# Patient Record
Sex: Female | Born: 1970 | Race: White | Hispanic: No | Marital: Married | Smoking: Never smoker
Health system: Southern US, Community
[De-identification: ages and names within clinical notes are randomized; demographics above are authoritative.]

## PROBLEM LIST (undated history)

## (undated) DIAGNOSIS — C541 Malignant neoplasm of endometrium: Secondary | ICD-10-CM

## (undated) DIAGNOSIS — I10 Essential (primary) hypertension: Secondary | ICD-10-CM

## (undated) DIAGNOSIS — E876 Hypokalemia: Secondary | ICD-10-CM

## (undated) HISTORY — DX: Morbid (severe) obesity due to excess calories: E66.01

## (undated) HISTORY — PX: ABDOMINAL HYSTERECTOMY: SHX81

## (undated) HISTORY — PX: APPENDECTOMY: SHX54

## (undated) HISTORY — PX: CHOLECYSTECTOMY: SHX55

---

## 2015-01-09 DIAGNOSIS — I10 Essential (primary) hypertension: Secondary | ICD-10-CM | POA: Insufficient documentation

## 2015-01-09 DIAGNOSIS — C541 Malignant neoplasm of endometrium: Secondary | ICD-10-CM | POA: Insufficient documentation

## 2015-01-09 DIAGNOSIS — K219 Gastro-esophageal reflux disease without esophagitis: Secondary | ICD-10-CM | POA: Insufficient documentation

## 2015-01-09 HISTORY — DX: Morbid (severe) obesity due to excess calories: E66.01

## 2018-02-27 DIAGNOSIS — Z8639 Personal history of other endocrine, nutritional and metabolic disease: Secondary | ICD-10-CM | POA: Diagnosis not present

## 2018-02-27 DIAGNOSIS — I10 Essential (primary) hypertension: Secondary | ICD-10-CM | POA: Diagnosis not present

## 2018-03-06 ENCOUNTER — Emergency Department: Payer: BLUE CROSS/BLUE SHIELD | Admitting: Anesthesiology

## 2018-03-06 ENCOUNTER — Ambulatory Visit: Admit: 2018-03-06 | Payer: BLUE CROSS/BLUE SHIELD | Admitting: Surgery

## 2018-03-06 ENCOUNTER — Observation Stay
Admission: EM | Admit: 2018-03-06 | Discharge: 2018-03-08 | Disposition: A | Discharge status: Home / Self Care | Payer: BLUE CROSS/BLUE SHIELD | Attending: Surgery | Admitting: Surgery

## 2018-03-06 ENCOUNTER — Other Ambulatory Visit: Payer: Self-pay

## 2018-03-06 ENCOUNTER — Emergency Department: Payer: BLUE CROSS/BLUE SHIELD

## 2018-03-06 ENCOUNTER — Encounter
Admission: EM | Disposition: A | Discharge status: Home / Self Care | Payer: Self-pay | Source: Home / Self Care | Attending: Emergency Medicine

## 2018-03-06 DIAGNOSIS — Z6841 Body Mass Index (BMI) 40.0 and over, adult: Secondary | ICD-10-CM | POA: Diagnosis not present

## 2018-03-06 DIAGNOSIS — I10 Essential (primary) hypertension: Secondary | ICD-10-CM | POA: Insufficient documentation

## 2018-03-06 DIAGNOSIS — Z90710 Acquired absence of both cervix and uterus: Secondary | ICD-10-CM | POA: Diagnosis not present

## 2018-03-06 DIAGNOSIS — Z79899 Other long term (current) drug therapy: Secondary | ICD-10-CM | POA: Diagnosis not present

## 2018-03-06 DIAGNOSIS — R1900 Intra-abdominal and pelvic swelling, mass and lump, unspecified site: Secondary | ICD-10-CM | POA: Diagnosis not present

## 2018-03-06 DIAGNOSIS — K353 Acute appendicitis with localized peritonitis, without perforation or gangrene: Secondary | ICD-10-CM | POA: Diagnosis not present

## 2018-03-06 DIAGNOSIS — Z8544 Personal history of malignant neoplasm of other female genital organs: Secondary | ICD-10-CM | POA: Diagnosis not present

## 2018-03-06 DIAGNOSIS — E876 Hypokalemia: Secondary | ICD-10-CM | POA: Insufficient documentation

## 2018-03-06 DIAGNOSIS — K3580 Unspecified acute appendicitis: Secondary | ICD-10-CM | POA: Diagnosis not present

## 2018-03-06 DIAGNOSIS — N200 Calculus of kidney: Secondary | ICD-10-CM | POA: Diagnosis not present

## 2018-03-06 DIAGNOSIS — R1031 Right lower quadrant pain: Secondary | ICD-10-CM | POA: Diagnosis not present

## 2018-03-06 DIAGNOSIS — K37 Unspecified appendicitis: Secondary | ICD-10-CM | POA: Diagnosis not present

## 2018-03-06 DIAGNOSIS — Z9049 Acquired absence of other specified parts of digestive tract: Secondary | ICD-10-CM

## 2018-03-06 HISTORY — DX: Malignant neoplasm of endometrium: C54.1

## 2018-03-06 HISTORY — DX: Essential (primary) hypertension: I10

## 2018-03-06 HISTORY — PX: LAPAROSCOPIC APPENDECTOMY: SHX408

## 2018-03-06 HISTORY — DX: Hypokalemia: E87.6

## 2018-03-06 LAB — CBC
HCT: 44.2 % (ref 35.0–47.0)
Hemoglobin: 15.7 g/dL (ref 12.0–16.0)
MCH: 31.1 pg (ref 26.0–34.0)
MCHC: 35.5 g/dL (ref 32.0–36.0)
MCV: 87.7 fL (ref 80.0–100.0)
Platelets: 309 10*3/uL (ref 150–440)
RBC: 5.03 MIL/uL (ref 3.80–5.20)
RDW: 14 % (ref 11.5–14.5)
WBC: 16.8 10*3/uL — ABNORMAL HIGH (ref 3.6–11.0)

## 2018-03-06 LAB — COMPREHENSIVE METABOLIC PANEL
ALT: 27 U/L (ref 14–54)
AST: 29 U/L (ref 15–41)
Albumin: 4.1 g/dL (ref 3.5–5.0)
Alkaline Phosphatase: 81 U/L (ref 38–126)
Anion gap: 8 (ref 5–15)
BUN: 8 mg/dL (ref 6–20)
CO2: 29 mmol/L (ref 22–32)
Calcium: 9.3 mg/dL (ref 8.9–10.3)
Chloride: 97 mmol/L — ABNORMAL LOW (ref 101–111)
Creatinine, Ser: 0.84 mg/dL (ref 0.44–1.00)
GFR calc Af Amer: >60 mL/min (ref >60)
GFR calc non Af Amer: >60 mL/min (ref >60)
Glucose, Bld: 123 mg/dL — ABNORMAL HIGH (ref 65–99)
Potassium: 2.8 mmol/L — ABNORMAL LOW (ref 3.5–5.1)
Sodium: 134 mmol/L — ABNORMAL LOW (ref 135–145)
Total Bilirubin: 1.7 mg/dL — ABNORMAL HIGH (ref 0.3–1.2)
Total Protein: 8.3 g/dL — ABNORMAL HIGH (ref 6.5–8.1)

## 2018-03-06 LAB — POCT I-STAT 4, (NA,K, GLUC, HGB,HCT)
Glucose, Bld: 120 mg/dL — ABNORMAL HIGH (ref 65–99)
HCT: 40 % (ref 36.0–46.0)
Hemoglobin: 13.6 g/dL (ref 12.0–15.0)
Potassium: 2.5 mmol/L — CL (ref 3.5–5.1)
Sodium: 137 mmol/L (ref 135–145)

## 2018-03-06 LAB — URINALYSIS, COMPLETE (UACMP) WITH MICROSCOPIC
Bilirubin Urine: NEGATIVE
Glucose, UA: NEGATIVE mg/dL
Ketones, ur: NEGATIVE mg/dL
Leukocytes, UA: NEGATIVE
Nitrite: NEGATIVE
Protein, ur: NEGATIVE mg/dL
Specific Gravity, Urine: 1.017 (ref 1.005–1.030)
pH: 7 (ref 5.0–8.0)

## 2018-03-06 LAB — LIPASE, BLOOD: Lipase: 24 U/L (ref 11–51)

## 2018-03-06 LAB — MAGNESIUM: Magnesium: 1.5 mg/dL — ABNORMAL LOW (ref 1.7–2.4)

## 2018-03-06 LAB — LACTIC ACID, PLASMA: Lactic Acid, Venous: 1.5 mmol/L (ref 0.5–1.9)

## 2018-03-06 IMAGING — CT CT ABD-PELV W/ CM
2 of 5 series · 15 of 46 positions shown, 17 images · IV contrast (APPLIED)
Comparison: None.

CLINICAL DATA: RIGHT lower quadrant pain since yesterday with
feeling of constipation. RIGHT flank pain yesterday.

EXAM:
CT ABDOMEN AND PELVIS WITH CONTRAST
TECHNIQUE: Multidetector CT imaging of the abdomen and pelvis was performed
using the standard protocol following bolus administration of
intravenous contrast.
CONTRAST:  125mL [VZ] IOPAMIDOL ([VZ]) INJECTION 61%

[Series 2: routine abd/pel with · axial · 0.83mm/px · z∈[-513,-58]mm · 12 of 103 slices shown, 14 images]
[im 6/103  soft-tissue]
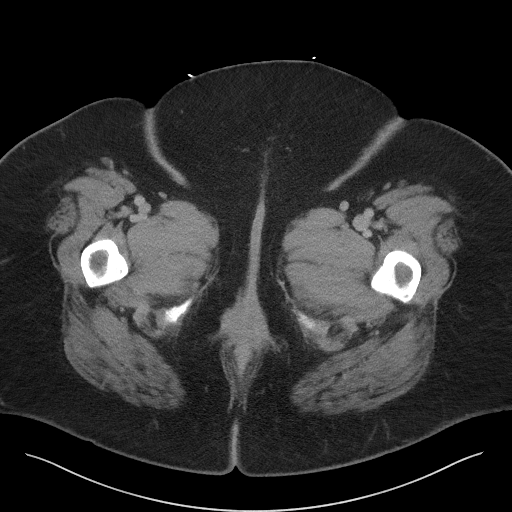
[im 6/103  bone]
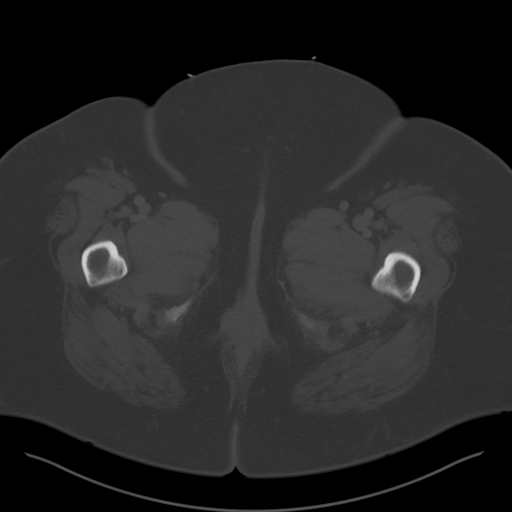
[im 18/103  soft-tissue]
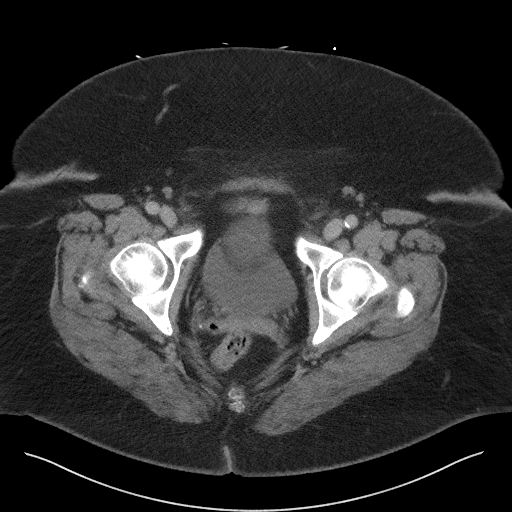
[im 23/103  soft-tissue]
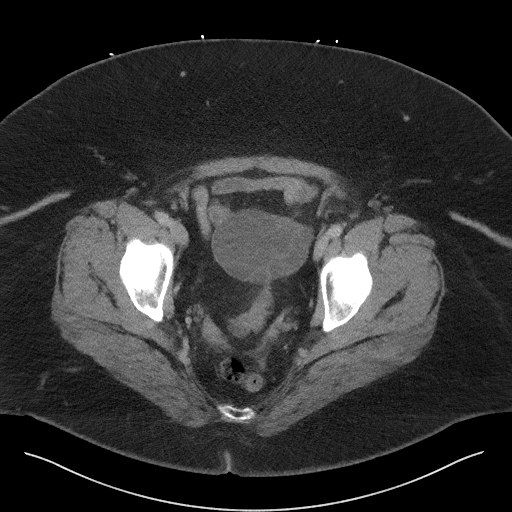
[im 29/103  soft-tissue]
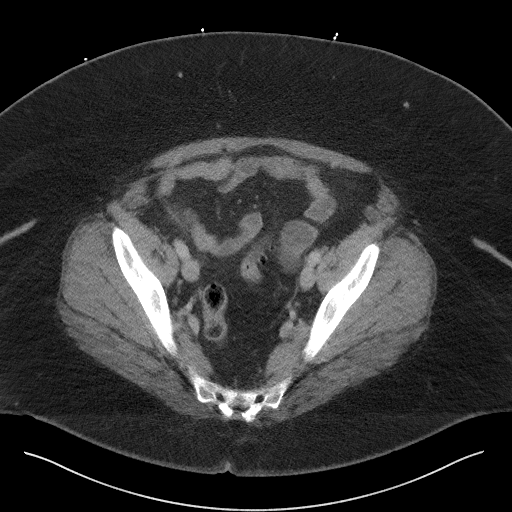
[im 40/103  soft-tissue]
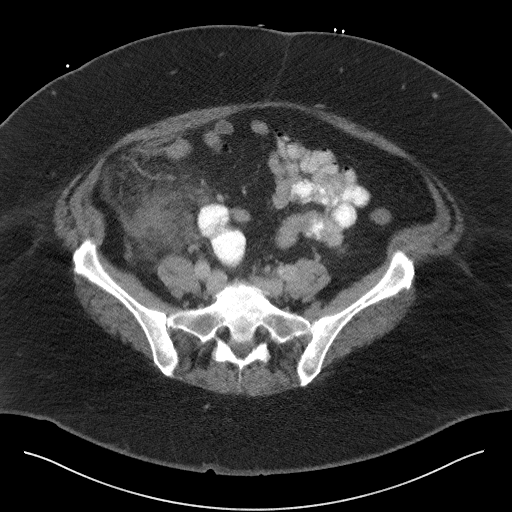
[im 46/103  soft-tissue]
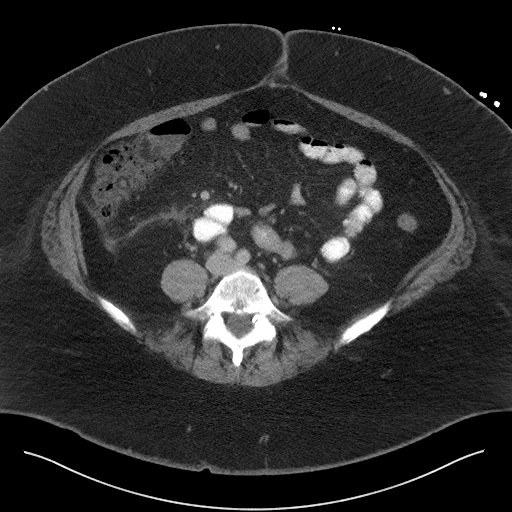
[im 57/103  soft-tissue]
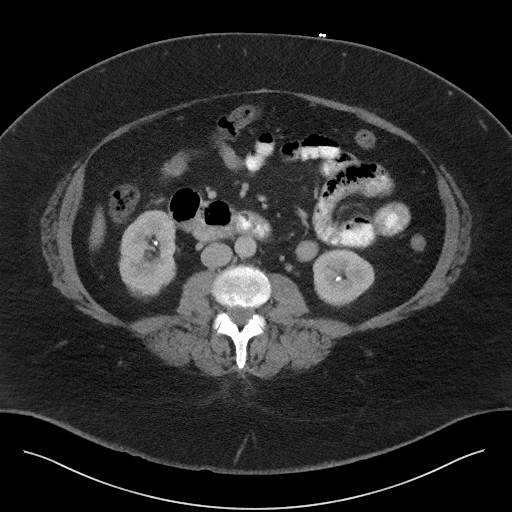
[im 63/103  soft-tissue]
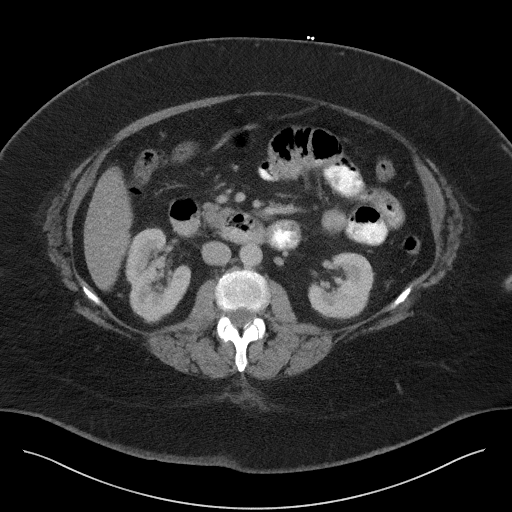
[im 74/103  soft-tissue]
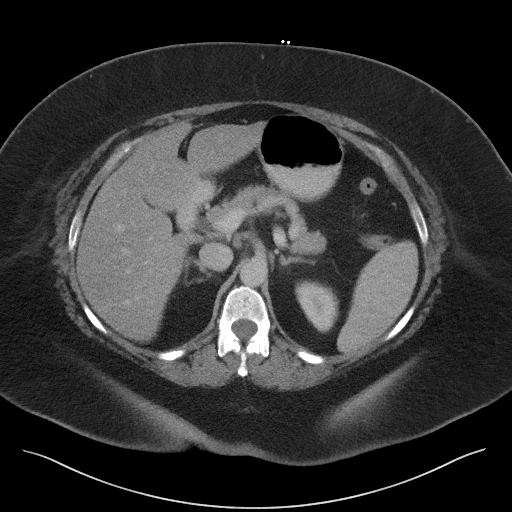
[im 74/103  bone]
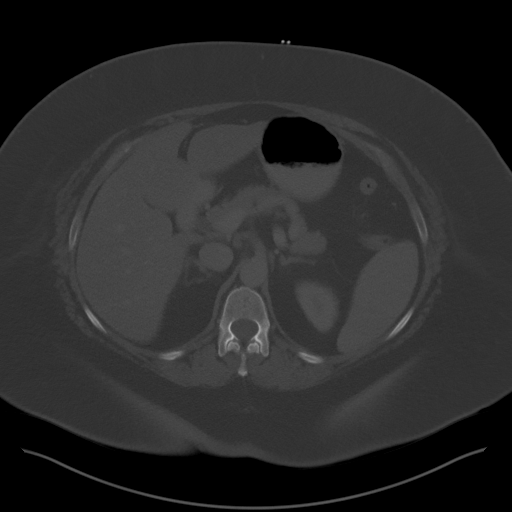
[im 80/103  soft-tissue]
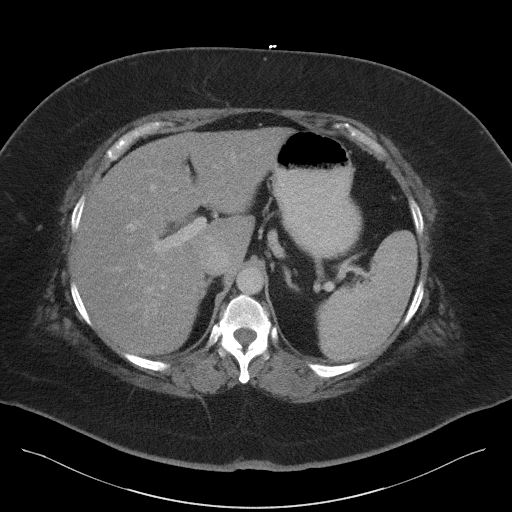
[im 86/103  soft-tissue]
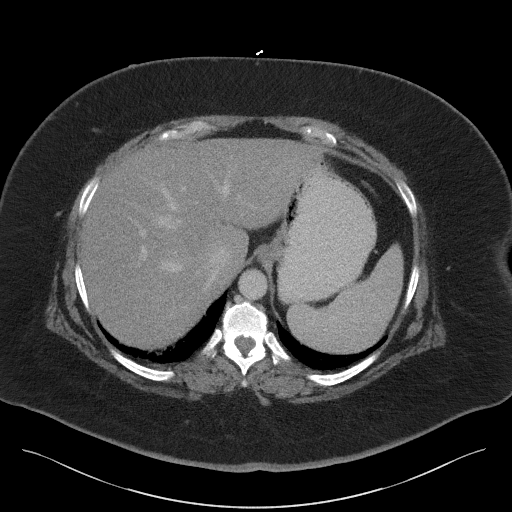
[im 97/103  soft-tissue]
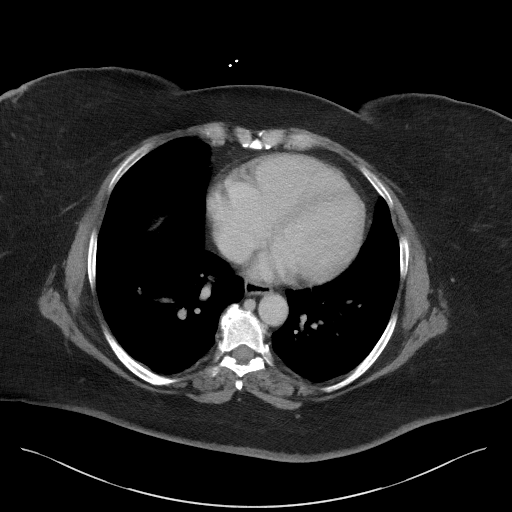

[Series 5: coronal st · coronal · 0.80mm/px · 3 of 91 slices shown]
[im 31/91  soft-tissue]
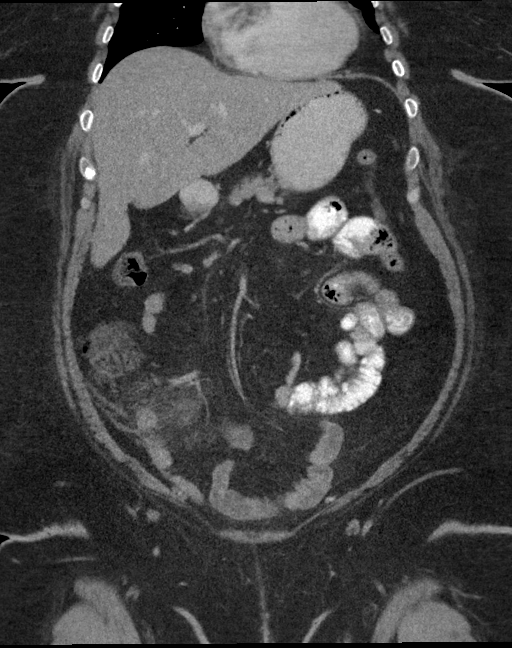
[im 41/91  soft-tissue]
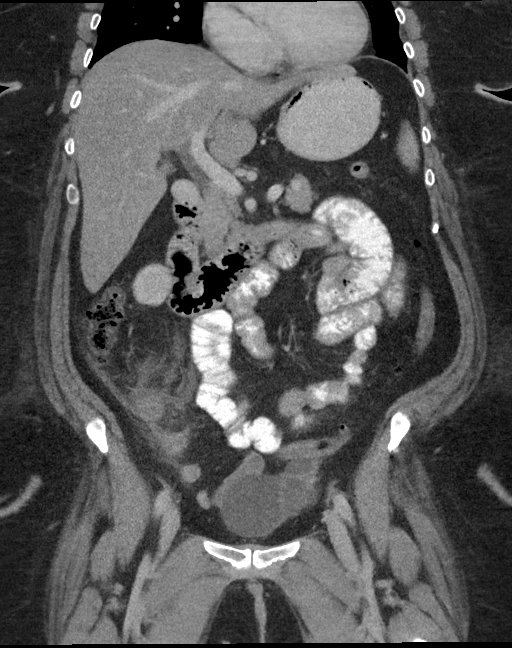
[im 51/91  soft-tissue]
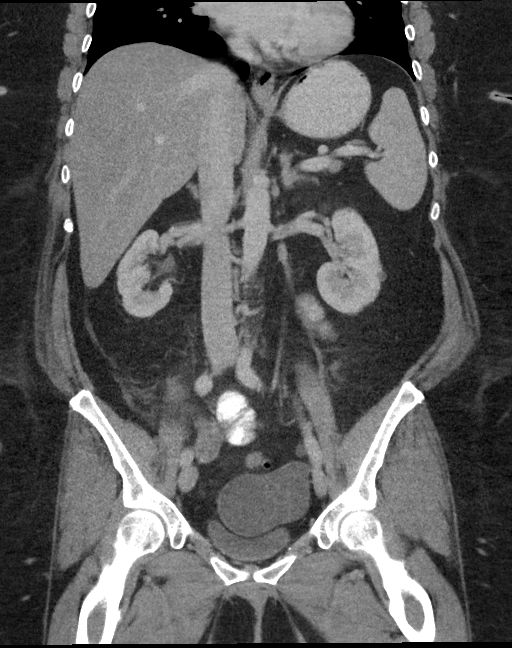

[15 of 46 positions shown; findings below may reference images not displayed]

FINDINGS: Lower chest: No acute abnormality.

Hepatobiliary: No focal liver abnormality is seen. Status post
cholecystectomy. No biliary dilatation.

Pancreas: Unremarkable. No pancreatic ductal dilatation or
surrounding inflammatory changes.

Spleen: Normal in size without focal abnormality.

Adrenals/Urinary Tract: Adrenal glands appear normal. 4 mm
nonobstructing LEFT renal stone. Three nonobstructing RIGHT renal
stones, measuring 1-4 mm in size. No hydronephrosis bilaterally. No
perinephric inflammation. No ureteral or bladder calculi identified.

Stomach/Bowel: Appendix is markedly distended, measuring up to 2 cm
diameter, with surrounding periappendiceal fluid stranding,
consistent with acute appendicitis.

Appendix: Location: RIGHT lower quadrant

Diameter: 2 cm

Appendicolith: Not seen

Mucosal hyper-enhancement: Present

Extraluminal gas: Not seen

Periappendiceal collection: No abscess collection seen. Extensive
periappendiceal fluid stranding.

Vascular/Lymphatic: No significant vascular findings are present. No
enlarged abdominal or pelvic lymph nodes.

Reproductive: Status post hysterectomy. Mixed solid and cystic mass
within the midline pelvis, likely originating from the LEFT adnexa,
likely ovarian origin, measuring 8 x 5.8 cm.

Other: No free intraperitoneal air.

Musculoskeletal: Mild degenerative change at the lumbosacral
junction. No acute or suspicious osseous finding.
IMPRESSION: 1. Acute appendicitis. Appendix is markedly distended, measuring 2
cm diameter, with wall thickening and extensive periappendiceal
fluid stranding. No periappendiceal abscess collection identified.
No free intraperitoneal air identified.
2. Suspicious mass within the pelvis, mixed cystic and solid
appearance, likely of LEFT ovarian origin, measuring 8 x 5.8 cm,
suspected ovarian neoplasm.
3. Bilateral nephrolithiasis. No hydronephrosis. No ureteral or
bladder calculi.

These results were called by telephone at the time of interpretation
on [DATE] at [DATE] to Dr. ENNIO , who verbally
acknowledged these results.

## 2018-03-06 SURGERY — APPENDECTOMY, LAPAROSCOPIC
Anesthesia: General | Site: Abdomen | Wound class: Dirty or Infected

## 2018-03-06 MED ORDER — SUCCINYLCHOLINE CHLORIDE 20 MG/ML IJ SOLN
INTRAMUSCULAR | Status: AC
  Filled 2018-03-06: qty 1

## 2018-03-06 MED ORDER — POTASSIUM CHLORIDE 10 MEQ/100ML IV SOLN
10.0000 meq | INTRAVENOUS | Status: AC
  Administered 2018-03-06 – 2018-03-07 (×3): 10 meq via INTRAVENOUS
  Filled 2018-03-06 (×3): qty 100

## 2018-03-06 MED ORDER — MORPHINE SULFATE (PF) 4 MG/ML IV SOLN
4.0000 mg | Freq: Once | INTRAVENOUS | Status: AC
  Administered 2018-03-06: 4 mg via INTRAVENOUS
  Filled 2018-03-06: qty 1

## 2018-03-06 MED ORDER — SUCCINYLCHOLINE CHLORIDE 20 MG/ML IJ SOLN
INTRAMUSCULAR | Status: DC | PRN
  Administered 2018-03-06: 120 mg via INTRAVENOUS

## 2018-03-06 MED ORDER — IOPAMIDOL (ISOVUE-300) INJECTION 61%
125.0000 mL | Freq: Once | INTRAVENOUS | Status: AC | PRN
  Administered 2018-03-06: 125 mL via INTRAVENOUS

## 2018-03-06 MED ORDER — ROCURONIUM BROMIDE 50 MG/5ML IV SOLN
INTRAVENOUS | Status: AC
  Filled 2018-03-06: qty 1

## 2018-03-06 MED ORDER — PHENYLEPHRINE HCL 10 MG/ML IJ SOLN
INTRAMUSCULAR | Status: AC
  Filled 2018-03-06: qty 1

## 2018-03-06 MED ORDER — PROPOFOL 10 MG/ML IV BOLUS
INTRAVENOUS | Status: AC
  Filled 2018-03-06: qty 20

## 2018-03-06 MED ORDER — DEXAMETHASONE SODIUM PHOSPHATE 10 MG/ML IJ SOLN
INTRAMUSCULAR | Status: AC
  Filled 2018-03-06: qty 1

## 2018-03-06 MED ORDER — PROPOFOL 10 MG/ML IV BOLUS
INTRAVENOUS | Status: DC | PRN
  Administered 2018-03-06: 150 mg via INTRAVENOUS

## 2018-03-06 MED ORDER — MORPHINE SULFATE (PF) 4 MG/ML IV SOLN
4.0000 mg | INTRAVENOUS | Status: DC | PRN
Start: 2018-03-06 — End: 2018-03-08
  Administered 2018-03-07 (×2): 4 mg via INTRAVENOUS
  Filled 2018-03-06 (×2): qty 1

## 2018-03-06 MED ORDER — LACTATED RINGERS IV SOLN
INTRAVENOUS | Status: DC | PRN
  Administered 2018-03-06 (×2): via INTRAVENOUS

## 2018-03-06 MED ORDER — ROCURONIUM BROMIDE 100 MG/10ML IV SOLN
INTRAVENOUS | Status: DC | PRN
  Administered 2018-03-06: 10 mg via INTRAVENOUS

## 2018-03-06 MED ORDER — PIPERACILLIN-TAZOBACTAM 4.5 G IVPB: 4.5000 g | Freq: Three times a day (TID) | INTRAVENOUS | Status: DC

## 2018-03-06 MED ORDER — ONDANSETRON HCL 4 MG PO TABS: 8.0000 mg | ORAL_TABLET | Freq: Three times a day (TID) | ORAL | Status: DC | PRN

## 2018-03-06 MED ORDER — ONDANSETRON HCL 4 MG/2ML IJ SOLN
INTRAMUSCULAR | Status: DC | PRN
  Administered 2018-03-06: 4 mg via INTRAVENOUS

## 2018-03-06 MED ORDER — FENTANYL CITRATE (PF) 100 MCG/2ML IJ SOLN
INTRAMUSCULAR | Status: AC
  Filled 2018-03-06: qty 2

## 2018-03-06 MED ORDER — ONDANSETRON HCL 4 MG/2ML IJ SOLN
INTRAMUSCULAR | Status: AC
  Filled 2018-03-06: qty 2

## 2018-03-06 MED ORDER — MIDAZOLAM HCL 2 MG/2ML IJ SOLN
INTRAMUSCULAR | Status: AC
  Filled 2018-03-06: qty 2

## 2018-03-06 MED ORDER — BUPIVACAINE HCL (PF) 0.25 % IJ SOLN
INTRAMUSCULAR | Status: AC
  Filled 2018-03-06: qty 30

## 2018-03-06 MED ORDER — POTASSIUM CHLORIDE 10 MEQ/100ML IV SOLN
10.0000 meq | INTRAVENOUS | Status: AC
  Filled 2018-03-06 (×3): qty 100

## 2018-03-06 MED ORDER — FENTANYL CITRATE (PF) 100 MCG/2ML IJ SOLN
INTRAMUSCULAR | Status: DC | PRN
  Administered 2018-03-06 (×4): 50 ug via INTRAVENOUS

## 2018-03-06 MED ORDER — SODIUM CHLORIDE 0.9 % IV BOLUS
1000.0000 mL | Freq: Once | INTRAVENOUS | Status: AC
  Administered 2018-03-06: 1000 mL via INTRAVENOUS

## 2018-03-06 MED ORDER — PIPERACILLIN-TAZOBACTAM 4.5 G IVPB
4.5000 g | Freq: Once | INTRAVENOUS | Status: DC
  Filled 2018-03-06: qty 100

## 2018-03-06 MED ORDER — MIDAZOLAM HCL 2 MG/2ML IJ SOLN
INTRAMUSCULAR | Status: DC | PRN
  Administered 2018-03-06: 2 mg via INTRAVENOUS

## 2018-03-06 MED ORDER — ENOXAPARIN SODIUM 40 MG/0.4ML ~~LOC~~ SOLN
40.0000 mg | Freq: Two times a day (BID) | SUBCUTANEOUS | Status: DC
  Administered 2018-03-07 – 2018-03-08 (×3): 40 mg via SUBCUTANEOUS
  Filled 2018-03-06 (×3): qty 0.4

## 2018-03-06 MED ORDER — MORPHINE SULFATE (PF) 4 MG/ML IV SOLN
4.0000 mg | Freq: Once | INTRAVENOUS | Status: AC
  Administered 2018-03-06: 4 mg via INTRAVENOUS

## 2018-03-06 MED ORDER — POTASSIUM CHLORIDE 10 MEQ/100ML IV SOLN
10.0000 meq | Freq: Once | INTRAVENOUS | Status: AC
  Administered 2018-03-06: 10 meq via INTRAVENOUS
  Filled 2018-03-06: qty 100

## 2018-03-06 MED ORDER — SUGAMMADEX SODIUM 200 MG/2ML IV SOLN
INTRAVENOUS | Status: DC | PRN
  Administered 2018-03-06: 295.8 mg via INTRAVENOUS

## 2018-03-06 MED ORDER — DEXAMETHASONE SODIUM PHOSPHATE 10 MG/ML IJ SOLN
INTRAMUSCULAR | Status: DC | PRN
  Administered 2018-03-06: 10 mg via INTRAVENOUS

## 2018-03-06 MED ORDER — SODIUM CHLORIDE 0.9 % IR SOLN
Status: DC | PRN
  Administered 2018-03-06: 1000 mL

## 2018-03-06 MED ORDER — LIDOCAINE HCL (PF) 2 % IJ SOLN
INTRAMUSCULAR | Status: AC
  Filled 2018-03-06: qty 10

## 2018-03-06 MED ORDER — FENTANYL CITRATE (PF) 100 MCG/2ML IJ SOLN: 25.0000 ug | INTRAMUSCULAR | Status: DC | PRN

## 2018-03-06 MED ORDER — SUGAMMADEX SODIUM 200 MG/2ML IV SOLN
INTRAVENOUS | Status: AC
  Filled 2018-03-06: qty 2

## 2018-03-06 MED ORDER — BUPIVACAINE HCL (PF) 0.25 % IJ SOLN
INTRAMUSCULAR | Status: DC | PRN
  Administered 2018-03-06: 20 mL

## 2018-03-06 MED ORDER — PHENYLEPHRINE HCL 10 MG/ML IJ SOLN
INTRAMUSCULAR | Status: DC | PRN
  Administered 2018-03-06 (×2): 100 ug via INTRAVENOUS

## 2018-03-06 MED ORDER — KCL IN DEXTROSE-NACL 40-5-0.45 MEQ/L-%-% IV SOLN
INTRAVENOUS | Status: DC
  Administered 2018-03-07 (×2): via INTRAVENOUS
  Filled 2018-03-06 (×4): qty 1000

## 2018-03-06 MED ORDER — PIPERACILLIN-TAZOBACTAM 4.5 G IVPB
4.5000 g | Freq: Once | INTRAVENOUS | Status: AC
  Administered 2018-03-06: 4.5 g via INTRAVENOUS
  Filled 2018-03-06: qty 0

## 2018-03-06 MED ORDER — HEPARIN SODIUM (PORCINE) 5000 UNIT/ML IJ SOLN
INTRAMUSCULAR | Status: DC | PRN
  Administered 2018-03-06: 5000 [IU] via SUBCUTANEOUS

## 2018-03-06 MED ORDER — ACETAMINOPHEN 10 MG/ML IV SOLN
INTRAVENOUS | Status: DC | PRN
  Administered 2018-03-06: 1000 mg via INTRAVENOUS

## 2018-03-06 MED ORDER — ONDANSETRON HCL 4 MG/2ML IJ SOLN
4.0000 mg | Freq: Once | INTRAMUSCULAR | Status: AC
  Administered 2018-03-06: 4 mg via INTRAVENOUS
  Filled 2018-03-06: qty 2

## 2018-03-06 MED ORDER — IOPAMIDOL (ISOVUE-300) INJECTION 61%
30.0000 mL | Freq: Once | INTRAVENOUS | Status: AC | PRN
  Administered 2018-03-06: 30 mL via ORAL

## 2018-03-06 MED ORDER — MORPHINE SULFATE (PF) 4 MG/ML IV SOLN
6.0000 mg | Freq: Once | INTRAVENOUS | Status: DC
  Filled 2018-03-06: qty 2

## 2018-03-06 MED ORDER — ONDANSETRON HCL 4 MG/2ML IJ SOLN: 4.0000 mg | Freq: Once | INTRAMUSCULAR | Status: DC | PRN

## 2018-03-06 MED ORDER — HEPARIN SODIUM (PORCINE) 5000 UNIT/ML IJ SOLN
INTRAMUSCULAR | Status: AC
  Filled 2018-03-06: qty 1

## 2018-03-06 MED ORDER — ACETAMINOPHEN 10 MG/ML IV SOLN
INTRAVENOUS | Status: AC
  Filled 2018-03-06: qty 100

## 2018-03-06 SURGICAL SUPPLY — 39 items
APPLIER CLIP 5 13 M/L LIGAMAX5 (MISCELLANEOUS) ×2
APPLIER CLIP ROT 10 11.4 M/L (STAPLE) ×2
BLADE CLIPPER SURG (BLADE) ×2 IMPLANT
CANISTER SUCT 1200ML W/VALVE (MISCELLANEOUS) ×2 IMPLANT
CHLORAPREP W/TINT 26ML (MISCELLANEOUS) ×2 IMPLANT
CLIP APPLIE 5 13 M/L LIGAMAX5 (MISCELLANEOUS) ×1 IMPLANT
CLIP APPLIE ROT 10 11.4 M/L (STAPLE) ×1 IMPLANT
CUTTER FLEX LINEAR 45M (STAPLE) ×2 IMPLANT
DERMABOND ADVANCED (GAUZE/BANDAGES/DRESSINGS) ×1
DERMABOND ADVANCED .7 DNX12 (GAUZE/BANDAGES/DRESSINGS) ×1 IMPLANT
ELECT CAUTERY BLADE 6.4 (BLADE) ×2 IMPLANT
ELECT REM PT RETURN 9FT ADLT (ELECTROSURGICAL) ×2
ELECTRODE REM PT RTRN 9FT ADLT (ELECTROSURGICAL) ×1 IMPLANT
GLOVE BIO SURGEON STRL SZ7 (GLOVE) ×2 IMPLANT
GOWN STRL REUS W/ TWL LRG LVL3 (GOWN DISPOSABLE) ×2 IMPLANT
GOWN STRL REUS W/TWL LRG LVL3 (GOWN DISPOSABLE) ×2
IRRIGATION STRYKERFLOW (MISCELLANEOUS) ×1 IMPLANT
IRRIGATOR STRYKERFLOW (MISCELLANEOUS) ×2
IV NS 1000ML (IV SOLUTION) ×1
IV NS 1000ML BAXH (IV SOLUTION) ×1 IMPLANT
NEEDLE HYPO 22GX1.5 SAFETY (NEEDLE) ×2 IMPLANT
NS IRRIG 500ML POUR BTL (IV SOLUTION) ×2 IMPLANT
PACK LAP CHOLECYSTECTOMY (MISCELLANEOUS) ×2 IMPLANT
PENCIL ELECTRO HAND CTR (MISCELLANEOUS) ×2 IMPLANT
POUCH SPECIMEN RETRIEVAL 10MM (ENDOMECHANICALS) ×2 IMPLANT
RELOAD 45 VASCULAR/THIN (ENDOMECHANICALS) ×4 IMPLANT
RELOAD STAPLE TA45 3.5 REG BLU (ENDOMECHANICALS) ×2 IMPLANT
SCALPEL HARMONIC ACE (MISCELLANEOUS) ×2 IMPLANT
SCISSORS METZENBAUM CVD 33 (INSTRUMENTS) ×2 IMPLANT
SLEEVE ENDOPATH XCEL 5M (ENDOMECHANICALS) ×2 IMPLANT
SPONGE LAP 18X18 5 PK (GAUZE/BANDAGES/DRESSINGS) ×2 IMPLANT
SUT MNCRL AB 4-0 PS2 18 (SUTURE) ×2 IMPLANT
SUT VICRYL 0 AB UR-6 (SUTURE) ×4 IMPLANT
SYR 20CC LL (SYRINGE) ×2 IMPLANT
TRAY FOLEY W/METER SILVER 16FR (SET/KITS/TRAYS/PACK) ×2 IMPLANT
TROCAR SL 12X100  BARIAT (TROCAR) ×2 IMPLANT
TROCAR XCEL BLUNT TIP 100MML (ENDOMECHANICALS) ×2 IMPLANT
TROCAR XCEL NON-BLD 5MMX100MML (ENDOMECHANICALS) ×4 IMPLANT
TUBING INSUF HEATED (TUBING) ×2 IMPLANT

## 2018-03-06 NOTE — Anesthesia Preprocedure Evaluation (Signed)
Anesthesia Evaluation  Patient identified by MRN, date of birth, ID band Patient awake    Reviewed: Allergy & Precautions, NPO status , Patient's Chart, lab work & pertinent test results, reviewed documented beta blocker date and time   Airway Mallampati: III  TM Distance: >3 FB     Dental  (+) Chipped   Pulmonary           Cardiovascular hypertension,      Neuro/Psych    GI/Hepatic   Endo/Other    Renal/GU      Musculoskeletal   Abdominal   Peds  Hematology   Anesthesia Other Findings Obese. K 2.8. Dr. Dahlia Byes confirms this is an emergency. Will start K and proceed.  Reproductive/Obstetrics                             Anesthesia Physical Anesthesia Plan  ASA: III  Anesthesia Plan: General   Post-op Pain Management:    Induction: Intravenous  PONV Risk Score and Plan:   Airway Management Planned: Oral ETT  Additional Equipment:   Intra-op Plan:   Post-operative Plan:   Informed Consent: I have reviewed the patients History and Physical, chart, labs and discussed the procedure including the risks, benefits and alternatives for the proposed anesthesia with the patient or authorized representative who has indicated his/her understanding and acceptance.     Plan Discussed with: CRNA  Anesthesia Plan Comments:         Anesthesia Quick Evaluation

## 2018-03-06 NOTE — Anesthesia Procedure Notes (Signed)
Procedure Name: Intubation Date/Time: 03/06/2018 8:01 PM Performed by: Nelda Marseille, CRNA Pre-anesthesia Checklist: Patient identified, Patient being monitored, Timeout performed, Emergency Drugs available and Suction available Patient Re-evaluated:Patient Re-evaluated prior to induction Oxygen Delivery Method: Circle system utilized Preoxygenation: Pre-oxygenation with 100% oxygen Induction Type: IV induction Ventilation: Mask ventilation without difficulty Laryngoscope Size: Mac, 3 and McGraph Grade View: Grade IV Tube type: Oral Tube size: 7.0 mm Number of attempts: 1 Airway Equipment and Method: Stylet and Video-laryngoscopy Placement Confirmation: ETT inserted through vocal cords under direct vision,  positive ETCO2 and breath sounds checked- equal and bilateral Secured at: 21 cm Tube secured with: Tape Dental Injury: Teeth and Oropharynx as per pre-operative assessment  Difficulty Due To: Difficulty was anticipated, Difficult Airway- due to large tongue and Difficult Airway- due to reduced neck mobility

## 2018-03-06 NOTE — Progress Notes (Signed)
I STAT performed. Potassium 2.5. Dr. Pat Patrick at bedside and aware. States proceed with potassium as previously ordered.

## 2018-03-06 NOTE — ED Notes (Signed)
Informed consent signed 

## 2018-03-06 NOTE — Anesthesia Postprocedure Evaluation (Signed)
Anesthesia Post Note  Patient: Ruth Pennington  Procedure(s) Performed: APPENDECTOMY LAPAROSCOPIC (N/A Abdomen)  Patient location during evaluation: PACU Anesthesia Type: General Level of consciousness: awake and alert Pain management: pain level controlled Vital Signs Assessment: post-procedure vital signs reviewed and stable Respiratory status: spontaneous breathing, nonlabored ventilation, respiratory function stable and patient connected to nasal cannula oxygen Cardiovascular status: blood pressure returned to baseline and stable Postop Assessment: no apparent nausea or vomiting Anesthetic complications: no     Last Vitals:  Vitals:   03/06/18 2131 03/06/18 2143  BP: 118/77 115/80  Pulse: 80 80  Resp: 20 17  Temp:  (!) 36.2 C  SpO2: 98% 99%    Last Pain:  Vitals:   03/06/18 2143  TempSrc:   PainSc: 0-No pain                 Jones Viviani S

## 2018-03-06 NOTE — Transfer of Care (Signed)
Immediate Anesthesia Transfer of Care Note  Patient: Ruth Pennington  Procedure(s) Performed: APPENDECTOMY LAPAROSCOPIC (N/A Abdomen)  Patient Location: PACU  Anesthesia Type:General  Level of Consciousness: awake and sedated  Airway & Oxygen Therapy: Patient Spontanous Breathing and Patient connected to face mask oxygen  Post-op Assessment: Report given to RN and Post -op Vital signs reviewed and stable  Post vital signs: Reviewed and stable  Last Vitals:  Vitals Value Taken Time  BP 123/69 03/06/2018  9:01 PM  Temp 37.4 C 03/06/2018  9:01 PM  Pulse 87 03/06/2018  9:02 PM  Resp 21 03/06/2018  9:02 PM  SpO2 98 % 03/06/2018  9:02 PM  Vitals shown include unvalidated device data.  Last Pain:  Vitals:   03/06/18 1721  TempSrc:   PainSc: Asleep         Complications: No apparent anesthesia complications

## 2018-03-06 NOTE — Progress Notes (Addendum)
Pharmacy Antibiotic Note  Ruth Pennington is a 47 y.o. female admitted on 03/06/2018 with Intra-abdominal infection. Pharmacy has been consulted for Zosyn dosing.  Plan: Patient is >120kg and CrCl=123.54ml/min. Will continue Zosyn 4.5 g IV q8h   Height: 5\' 6"  (167.6 cm) Weight: (!) 326 lb (147.9 kg) IBW/kg (Calculated) : 59.3  Temp (24hrs), Avg:99.7 F (37.6 C), Min:99.7 F (37.6 C), Max:99.7 F (37.6 C)  Recent Labs  Lab 03/06/18 1145 03/06/18 1445  WBC 16.8*  --   CREATININE 0.84  --   LATICACIDVEN  --  1.5    Estimated Creatinine Clearance: 123.8 mL/min (by C-G formula based on SCr of 0.84 mg/dL).    No Known Allergies  Antimicrobials this admission: 4/12 Zosyn >>   Dose adjustments this admission:   Microbiology results: 4/12 BCx: sent  Thank you for allowing pharmacy to be a part of this patient's care.  Candelaria Stagers, PharmD Pharmacy Resident  03/06/2018 5:18 PM

## 2018-03-06 NOTE — ED Triage Notes (Signed)
Pt c/o RLQ pain since yesterday with a feeling of constipation. denies vomiting or diarrhea..states she did have pain in the right flank yesterday, denies hx of kidney stones.

## 2018-03-06 NOTE — ED Notes (Signed)
Pt assisted to bathroom to void

## 2018-03-06 NOTE — Anesthesia Post-op Follow-up Note (Signed)
Anesthesia QCDR form completed.        

## 2018-03-06 NOTE — ED Notes (Signed)
Pt resting comfortably with eyes closed - spouse at bedside

## 2018-03-06 NOTE — Op Note (Signed)
03/06/2018  9:00 PM  PATIENT:  Ruth Pennington  47 y.o. female  PRE-OPERATIVE DIAGNOSIS:  Appendicitis  POST-OPERATIVE DIAGNOSIS:  Appendicitis  PROCEDURE:  Procedure(s): APPENDECTOMY LAPAROSCOPIC (N/A)  SURGEON:  Surgeon(s) and Role:    * Jeanie Cooks, MD - Primary   ASSISTANTS: none none    ANESTHESIA: General  EBL:  Total I/O In: 1000 [I.V.:1000] Out: 165 [Urine:150; Blood:15]   DRAINS: None LOCAL MEDICATIONS USED: Bupivacaine  DISPOSITION OF SPECIMEN: Pathology  DICTATION: .  With the patient supine position and after induction of appropriate general anesthesia the patient's abdomen was prepped with ChloraPrep and draped sterile towels.  The patient was placed in the headdown feet up position.  A small infraumbilical incision was made in standard fashion and carried down through the subcutaneous tissue using blunt dissection.  A tracheal hook was used to elevate the fascia and a Veress needle was inserted without difficulty.  CO2 was insufflated to appropriate pressure measurements.  When approximately 2 L of CO2 were instilled the Veress needle was withdrawn and an 11 mm Optiview port inserted in the peritoneal cavity.  Intraperitoneal position was confirmed and CO2 was reinsufflated.  The abdomen was examined.  It appeared to be a large amount of inflammatory change without gross contamination in the right lower quadrant.  A left lower quadrant incision was made at 11 mm port inserted under direct vision.  A suprapubic incision was made and a 12 mm port inserted under direct vision.  Dissection was carried out through the 2 lower ports.  The appendix was identified behind several loops of small bowel adherent to the cecum and small bowel.  Tedious dissection was required to expose the mesoappendix and the base of the appendix.  Mesoappendix was divided with 2 applications of the Endo GIA stapling device carrying a white load.  A small appendiceal artery was not included in  the original stapling and had to be clipped in order to provide appropriate hemostasis.  The base of the appendix was identified.  In order to reach the base of the appendix from the angle required the left lower quadrant 11 mm port was replaced with a 12 mm port.  The base appendix was stapled.  The appendix was removed.  A blue load was utilized with the Endo GIA stapling device.  An Endo Catch apparatus was used for removal of the appendix.  The abdomen was then irrigated with 2 L of warm saline solution.  The pelvis was examined in order to better delineate the left ovarian mass identified on CT scan.  Multiple pictures were taken.  It appeared to be a large cyst.  No attempt was made at resection in this particular case.  All ports withdrawn without difficulty.  The skin incisions were closed with 5-0 nylon.  The area was infiltrated with 0.25% Marcaine for postoperative pain control.  Sterile dressings were applied.  Patient returned to the recovery room having tolerated the procedure well.  Sponge instrument needle count were correct x2 in the operating room.  PLAN OF CARE: Admit to inpatient   PATIENT DISPOSITION:  PACU - hemodynamically stable.   Dia Crawford III, MD

## 2018-03-06 NOTE — ED Notes (Signed)
Pharmacy emailed to send zosyn 4.5g

## 2018-03-06 NOTE — H&P (Addendum)
Patient ID: Ruth Pennington, female   DOB: February 12, 1971, 47 y.o.   MRN: [id number]  HPI Ruth Pennington is a 47 y.o. female with a 1 day history of back pain and right lower quadrant pain.  She reports that her pain is severe, constant and sharp in nature.  She initially had the pain in her back now radiated to the right lower quadrant.  She experienced some chills and some nausea.  No fevers.  Her surgical history is significant for abdominal hysterectomy with salpingectomy robotically 3 years ago for endometrial cancer.  She is super morbidly obese but has good cardiovascular performance able to perform more than 4 metastases without any shortness of breath or chest pain. Probably workup revealed a normal creatinine some hypokalemia and white count of 17,000 with a left shift.  CT scan personally reviewed showing evidence of acute appendicitis no evidence of free air there is significant inflammatory component almost to the point of being a phlegmon.  HPI  Past Medical History:  Diagnosis Date  . Endometrial cancer ([place])   . Hypertension   . Hypokalemia     Past Surgical History:  Procedure Laterality Date  . ABDOMINAL HYSTERECTOMY    . CHOLECYSTECTOMY      No family history on file.  Social History Social History   Tobacco Use  . Smoking status: Never Smoker  . Smokeless tobacco: Never Used  Substance Use Topics  . Alcohol use: Yes    Comment: occassionally  . Drug use: Never    No Known Allergies  Current Facility-Administered Medications  Medication Dose Route Frequency Provider Last Rate Last Dose  . morphine 4 MG/ML injection 4 mg  4 mg Intravenous Once Delman Kitten, MD      . piperacillin-tazobactam (ZOSYN) IVPB 4.5 g  4.5 g Intravenous Once Shuder, [place], RPH      . sodium chloride 0.9 % bolus 1,000 mL  1,000 mL Intravenous Once Pabon, Marjory Lies, MD       Current Outpatient Medications  Medication Sig Dispense Refill  . atenolol-chlorthalidone (TENORETIC) 50-25 MG  tablet Take 1 tablet by mouth daily.    . potassium chloride SA (K-DUR,KLOR-CON) 20 MEQ tablet Take 20 mEq by mouth daily.       Review of Systems Full ROS  was asked and was negative except for the information on the HPI  Physical Exam Blood pressure 127/64, pulse 85, temperature 99.7 F (37.6 C), temperature source Oral, resp. rate 17, height 5\' 6"  (1.676 m), weight (!) 147.9 kg (326 lb), SpO2 98 %. CONSTITUTIONAL: Morbidly obese female  EYES: Pupils are equal, round, and reactive to light, Sclera are non-icteric. EARS, NOSE, MOUTH AND THROAT: The oropharynx is clear. The oral mucosa is pink and moist. Hearing is intact to voice. LYMPH NODES:  Lymph nodes in the neck are normal. RESPIRATORY:  Lungs are clear. There is normal respiratory effort, with equal breath sounds bilaterally, and without pathologic use of accessory muscles. CARDIOVASCULAR: Heart is regular without murmurs, gallops, or rubs. GI: LArge panus, TTP RLQ w some rebound tenderness and focal peritonitisGU: Rectal deferred.   MUSCULOSKELETAL: Normal muscle strength and tone. No cyanosis or edema.   SKIN: Turgor is good and there are no pathologic skin lesions or ulcers. NEUROLOGIC: Motor and sensation is grossly normal. Cranial nerves are grossly intact. PSYCH:  Oriented to person, place and time. Affect is normal.  Data Reviewed  I have personally reviewed the patient's imaging, laboratory findings and medical records.  Assessment/Plan Acute appendicitis with focal peritonitis in a patient that is morbidly obese and a history of endometrial cancer with an associated left ovarian mass.  Given her clinical presentation and increase in the white count and imaging as I do recommend laparoscopic appendectomy.  I had a lengthy discussion with the patient and the family that this is not going to be a case that is easy given her body habitus and given the degree of inflammatory response and there is a good chance that we will  be doing this open or even a right hemicolectomy if we are unable to have good planes and good margins on the appendix.  I do however think that the best therapy for her given her peritonitis is surgical intervention.  We will start antibiotic therapy, aggressive resuscitation and post her for tonight as an emergency. D/W the OR, given the time we will start the case around 7pm and Dr. Pat Patrick is on call and he will be the surgeon performing the procedure. D/W the Nursing staff. Caroleen Hamman, MD FACS General Surgeon 03/06/2018, 4:20 PM

## 2018-03-06 NOTE — ED Provider Notes (Signed)
[facility] Emergency Department Provider Note   ____________________________________________   First MD Initiated Contact with Patient 03/06/18 1334     (approximate)  I have reviewed the triage vital signs and the nursing notes.   HISTORY  Chief Complaint Abdominal Pain    HPI Ruth Pennington is a 47 y.o. female presents with a previous history of endometrial cancer with a hysterectomy, also history of chronic hypokalemia where she reports her normal potassium is just above 3.  Also had a previous cholecystectomy about 10 years ago  Patient reports a severe somewhat sharp right lower abdominal pain.  Started this morning, but yesterday also had discomfort into her right mid flank and back.  She reports it feels like the pain has moved down to the right lower side.  Her husband had to come home from work due to increasing severity of pain.  She reports nausea but no vomiting.  No chest pain or trouble breathing.  No headache or neck pain.  Symptoms are worsened by movement.  Husband is concerned she might have "appendicitis".  Denies vaginal bleeding.  Denies vaginal symptoms.  Does report she still has both ovaries.   Past Medical History:  Diagnosis Date  . Endometrial cancer ([place])   . Hypertension   . Hypokalemia     Patient Active Problem List   Diagnosis Date Noted  . Acute appendicitis     Past Surgical History:  Procedure Laterality Date  . ABDOMINAL HYSTERECTOMY    . CHOLECYSTECTOMY      Prior to Admission medications   Medication Sig Start Date End Date Taking? Authorizing Provider  atenolol-chlorthalidone (TENORETIC) 50-25 MG tablet Take 1 tablet by mouth daily.   Yes [provider]  potassium chloride SA (K-DUR,KLOR-CON) 20 MEQ tablet Take 20 mEq by mouth daily.   Yes [provider]    Allergies Patient has no known allergies.  No family history on file.  Social History Social History   Tobacco Use  .  Smoking status: Never Smoker  . Smokeless tobacco: Never Used  Substance Use Topics  . Alcohol use: Yes    Comment: occassionally  . Drug use: Never    Review of Systems Constitutional: Feeling warm and chilled increased fatigue last day, but was able to go for a walk yesterday but noticed pain starting at that time ls Eyes: No visual changes. ENT: No sore throat. Cardiovascular: Denies chest pain. Respiratory: Denies shortness of breath. Gastrointestinal:  No diarrhea.  No constipation. Genitourinary: Negative for dysuria. Musculoskeletal: Negative for back pain. Skin: Negative for rash. Neurological: Negative for headaches, focal weakness or numbness.    ____________________________________________   PHYSICAL EXAM:  VITAL SIGNS: ED Triage Vitals  Enc Vitals Group     BP 03/06/18 1139 (!) 98/54     Pulse Rate 03/06/18 1139 (!) 117     Resp 03/06/18 1139 18     Temp 03/06/18 1139 99.7 F (37.6 C)     Temp Source 03/06/18 1139 Oral     SpO2 03/06/18 1139 99 %     Weight 03/06/18 1140 (!) 326 lb (147.9 kg)     Height 03/06/18 1140 5\' 6"  (1.676 m)     Head Circumference --      Peak Flow --      Pain Score 03/06/18 1140 9     Pain Loc --      Pain Edu? --      Excl. in [place]? --  Constitutional: Alert and oriented. Well appearing and in no acute distress. Eyes: Conjunctivae are normal. Head: Atraumatic. Nose: No congestion/rhinnorhea. Mouth/Throat: Mucous membranes are moist. Neck: No stridor.   Cardiovascular: Normal rate, regular rhythm. Grossly normal heart sounds.  Good peripheral circulation. Respiratory: Normal respiratory effort.  No retractions. Lungs CTAB. Gastrointestinal: Soft, normal skin exam of the abdomen.  He has notable discomfort and pain moderate to palpation of the right flank and right lower quadrant.  Patient has rebound tenderness over the right side of the abdomen, particularly in the right mid to right lower quadrant.  There is pain at  McBurney's point.  Negative Rovsing.  No left-sided abdominal pain.  No distention.  Musculoskeletal: No lower extremity tenderness nor edema. Neurologic:  Normal speech and language. No gross focal neurologic deficits are appreciated.  Skin:  Skin is warm, dry and intact. No rash noted. Psychiatric: Mood and affect are normal. Speech and behavior are normal.  ____________________________________________   LABS (all labs ordered are listed, but only abnormal results are displayed)  Labs Reviewed  COMPREHENSIVE METABOLIC PANEL - Abnormal; Notable for the following components:      Result Value   Sodium 134 (*)    Potassium 2.8 (*)    Chloride 97 (*)    Glucose, Bld 123 (*)    Total Protein 8.3 (*)    Total Bilirubin 1.7 (*)    All other components within normal limits  CBC - Abnormal; Notable for the following components:   WBC 16.8 (*)    All other components within normal limits  URINALYSIS, COMPLETE (UACMP) WITH MICROSCOPIC - Abnormal; Notable for the following components:   Color, Urine YELLOW (*)    APPearance HAZY (*)    Hgb urine dipstick SMALL (*)    Bacteria, UA RARE (*)    Squamous Epithelial / LPF 6-30 (*)    All other components within normal limits  MAGNESIUM - Abnormal; Notable for the following components:   Magnesium 1.5 (*)    All other components within normal limits  CULTURE, BLOOD (ROUTINE X 2)  CULTURE, BLOOD (ROUTINE X 2)  LIPASE, BLOOD  LACTIC ACID, PLASMA  LACTIC ACID, PLASMA   ____________________________________________  EKG   ____________________________________________  RADIOLOGY    CT scan reviewed, please see final report by radiologist.  Acute appendicitis.  Also concerning mass in the left lower pelvis.  Discussed both the appendicitis and need for surgery consult with the patient, and also discussed with her the pelvic mass.  She has a gynecologic oncologist who she sees and reports that she will definitely set up close follow-up as  soon as possible. ____________________________________________   PROCEDURES  Procedure(s) performed: None  Procedures  Critical Care performed: No  ____________________________________________   INITIAL IMPRESSION / ASSESSMENT AND PLAN / ED COURSE  Pertinent labs & imaging results that were available during my care of the patient were reviewed by me and considered in my medical decision making (see chart for details).  Differential diagnosis includes but is not limited to, abdominal perforation, aortic dissection, cholecystitis, appendicitis, diverticulitis, colitis, esophagitis/gastritis, kidney stone, pyelonephritis, urinary tract infection, aortic aneurysm. All are considered in decision and treatment plan. Based upon the patient's presentation and risk factors, will proceed with IV fluids, CT scan to further evaluate because of right-sided peritonitis including possible appendicitis.  ----------------------------------------- 4:26 PM on 03/06/2018 -----------------------------------------  Patient be admitted to general surgery, Dr. Dahlia Byes.  Also discussed pelvic mass with Dr. Dahlia Byes.       ____________________________________________  FINAL CLINICAL IMPRESSION(S) / ED DIAGNOSES  Final diagnoses:  Acute appendicitis, unspecified acute appendicitis type  Pelvic mass      NEW MEDICATIONS STARTED DURING THIS VISIT:  New Prescriptions   No medications on file     Note:  This document was prepared using Dragon voice recognition software and may include unintentional dictation errors.     Delman Kitten, MD 03/06/18 1626

## 2018-03-06 NOTE — ED Notes (Signed)
Report given to OR RN.

## 2018-03-06 NOTE — Care Management (Signed)
EKG shows Rbbb. No cardiac symptoms.

## 2018-03-07 ENCOUNTER — Encounter: Payer: Self-pay | Admitting: Surgery

## 2018-03-07 LAB — BASIC METABOLIC PANEL
Anion gap: 7 (ref 5–15)
Anion gap: 7 (ref 5–15)
BUN: 6 mg/dL (ref 6–20)
BUN: 5 mg/dL — ABNORMAL LOW (ref 6–20)
CO2: 29 mmol/L (ref 22–32)
CO2: 30 mmol/L (ref 22–32)
Calcium: 8.2 mg/dL — ABNORMAL LOW (ref 8.9–10.3)
Calcium: 8.4 mg/dL — ABNORMAL LOW (ref 8.9–10.3)
Chloride: 101 mmol/L (ref 101–111)
Chloride: 100 mmol/L — ABNORMAL LOW (ref 101–111)
Creatinine, Ser: 0.69 mg/dL (ref 0.44–1.00)
Creatinine, Ser: 0.75 mg/dL (ref 0.44–1.00)
GFR calc Af Amer: >60 mL/min (ref >60)
GFR calc Af Amer: >60 mL/min (ref >60)
GFR calc non Af Amer: >60 mL/min (ref >60)
GFR calc non Af Amer: >60 mL/min (ref >60)
Glucose, Bld: 170 mg/dL — ABNORMAL HIGH (ref 65–99)
Glucose, Bld: 101 mg/dL — ABNORMAL HIGH (ref 65–99)
Potassium: 2.6 mmol/L — CL (ref 3.5–5.1)
Potassium: 3.1 mmol/L — ABNORMAL LOW (ref 3.5–5.1)
Sodium: 137 mmol/L (ref 135–145)
Sodium: 137 mmol/L (ref 135–145)

## 2018-03-07 LAB — CBC
HCT: 39.1 % (ref 35.0–47.0)
Hemoglobin: 13.7 g/dL (ref 12.0–16.0)
MCH: 31.3 pg (ref 26.0–34.0)
MCHC: 35.1 g/dL (ref 32.0–36.0)
MCV: 89 fL (ref 80.0–100.0)
Platelets: 222 10*3/uL (ref 150–440)
RBC: 4.39 MIL/uL (ref 3.80–5.20)
RDW: 13.8 % (ref 11.5–14.5)
WBC: 12.3 10*3/uL — ABNORMAL HIGH (ref 3.6–11.0)

## 2018-03-07 MED ORDER — MAGNESIUM SULFATE 4 GM/100ML IV SOLN
4.0000 g | Freq: Once | INTRAVENOUS | Status: AC
  Administered 2018-03-07: 4 g via INTRAVENOUS
  Filled 2018-03-07: qty 100

## 2018-03-07 MED ORDER — POTASSIUM CHLORIDE CRYS ER 20 MEQ PO TBCR
40.0000 meq | EXTENDED_RELEASE_TABLET | Freq: Two times a day (BID) | ORAL | Status: DC
  Administered 2018-03-07 – 2018-03-08 (×3): 40 meq via ORAL
  Filled 2018-03-07 (×3): qty 2

## 2018-03-07 MED ORDER — PIPERACILLIN-TAZOBACTAM 3.375 G IVPB
3.3750 g | Freq: Three times a day (TID) | INTRAVENOUS | Status: DC
  Administered 2018-03-07 – 2018-03-08 (×4): 3.375 g via INTRAVENOUS
  Filled 2018-03-07 (×4): qty 50

## 2018-03-07 NOTE — Progress Notes (Signed)
Per Dr. Dahlia Byes okay to discontinue order for continuous pulse ox.

## 2018-03-07 NOTE — Progress Notes (Signed)
Dr Pat Patrick notified of critical potassium 2.6, acknowledged no new orders.

## 2018-03-07 NOTE — Progress Notes (Signed)
1 Day Post-Op   Subjective: Complaining of mild gas pain today.  Her incisional pain is improved.  She is not nauseated.  She has not had a bowel movement at the present time.  Her white count remains slightly elevated and her potassium is below normal.  Vital signs in last 24 hours: Temp:  [97.2 F (36.2 C)-99.4 F (37.4 C)] 97.9 F (36.6 C) (04/13 1434) Pulse Rate:  [74-90] 75 (04/13 1434) Resp:  [15-20] 16 (04/13 1434) BP: (84-123)/(50-87) 100/55 (04/13 1609) SpO2:  [93 %-100 %] 98 % (04/13 1434) Weight:  [143.1 kg (315 lb 6.4 oz)] 143.1 kg (315 lb 6.4 oz) (04/12 2211) Last BM Date: 03/06/18  Intake/Output from previous day: 04/12 0701 - 04/13 0700 In: 3102 [I.V.:1576; IV Piggyback:1526] Out: 5462 [Urine:1550; Blood:15]  GI: Abdomen is soft with minimal drainage on her dressings mild incisional tenderness.  Lab Results:  CBC Recent Labs    03/06/18 1145  03/06/18 2119 03/07/18 0536  WBC 16.8*  --   --  12.3*  HGB 15.7   < > 13.6 13.7  HCT 44.2   < > 40.0 39.1  PLT 309  --   --  222   < > = values in this interval not displayed.   CMP     Component Value Date/Time   NA 137 03/07/2018 1413   K 3.1 (L) 03/07/2018 1413   CL 100 (L) 03/07/2018 1413   CO2 30 03/07/2018 1413   GLUCOSE 101 (H) 03/07/2018 1413   BUN 5 (L) 03/07/2018 1413   CREATININE 0.75 03/07/2018 1413   CALCIUM 8.4 (L) 03/07/2018 1413   PROT 8.3 (H) 03/06/2018 1145   ALBUMIN 4.1 03/06/2018 1145   AST 29 03/06/2018 1145   ALT 27 03/06/2018 1145   ALKPHOS 81 03/06/2018 1145   BILITOT 1.7 (H) 03/06/2018 1145   GFRNONAA >60 03/07/2018 1413   GFRAA >60 03/07/2018 1413   PT/INR No results for input(s): LABPROT, INR in the last 72 hours.  Studies/Results: Ct Abdomen Pelvis W Contrast  Result Date: 03/06/2018 CLINICAL DATA:  RIGHT lower quadrant pain since yesterday with feeling of constipation. RIGHT flank pain yesterday. EXAM: CT ABDOMEN AND PELVIS WITH CONTRAST TECHNIQUE: Multidetector CT  imaging of the abdomen and pelvis was performed using the standard protocol following bolus administration of intravenous contrast. CONTRAST:  170mL ISOVUE-300 IOPAMIDOL (ISOVUE-300) INJECTION 61% COMPARISON:  None. FINDINGS: Lower chest: No acute abnormality. Hepatobiliary: No focal liver abnormality is seen. Status post cholecystectomy. No biliary dilatation. Pancreas: Unremarkable. No pancreatic ductal dilatation or surrounding inflammatory changes. Spleen: Normal in size without focal abnormality. Adrenals/Urinary Tract: Adrenal glands appear normal. 4 mm nonobstructing LEFT renal stone. Three nonobstructing RIGHT renal stones, measuring 1-4 mm in size. No hydronephrosis bilaterally. No perinephric inflammation. No ureteral or bladder calculi identified. Stomach/Bowel: Appendix is markedly distended, measuring up to 2 cm diameter, with surrounding periappendiceal fluid stranding, consistent with acute appendicitis. Appendix: Location: RIGHT lower quadrant Diameter: 2 cm Appendicolith: Not seen Mucosal hyper-enhancement: Present Extraluminal gas: Not seen Periappendiceal collection: No abscess collection seen. Extensive periappendiceal fluid stranding. Vascular/Lymphatic: No significant vascular findings are present. No enlarged abdominal or pelvic lymph nodes. Reproductive: Status post hysterectomy. Mixed solid and cystic mass within the midline pelvis, likely originating from the LEFT adnexa, likely ovarian origin, measuring 8 x 5.8 cm. Other: No free intraperitoneal air. Musculoskeletal: Mild degenerative change at the lumbosacral junction. No acute or suspicious osseous finding. IMPRESSION: 1. Acute appendicitis. Appendix is markedly distended, measuring 2 cm  diameter, with wall thickening and extensive periappendiceal fluid stranding. No periappendiceal abscess collection identified. No free intraperitoneal air identified. 2. Suspicious mass within the pelvis, mixed cystic and solid appearance, likely of  LEFT ovarian origin, measuring 8 x 5.8 cm, suspected ovarian neoplasm. 3. Bilateral nephrolithiasis. No hydronephrosis. No ureteral or bladder calculi. These results were called by telephone at the time of interpretation on 03/06/2018 at 3:42 pm to Dr. Delman Kitten , who verbally acknowledged these results. Electronically Signed   By: Franki Cabot M.D.   On: 03/06/2018 15:45    Assessment/Plan: Doing well postoperatively.  We will slowly advance her diet and activity level.  We anticipate continuing antibiotics for another 24 hours with discharge plan to probably on 4/15.  I discussed this plan with the patient and her family and they are in agreement.

## 2018-03-07 NOTE — Progress Notes (Signed)
   03/07/18 1105  Clinical Encounter Type  Visited With Patient and family together  Visit Type Initial  Referral From Nurse  Consult/Referral To [place]   [place] received an OR to educate PT on an AD. CH gave PT and her husband information and will follow up as needed.

## 2018-03-07 NOTE — Progress Notes (Signed)
POD # 1 Doing well main issue is low K Added PO KCL AVSS Pain improving  PE NAD Abd: soft, incision w dressing in place, improvement in exam as compared to yesterday  A/P Doing well Replace K recheck in pm Ambulate Continue A/BS DC in 24-48 hrs depending on clinical improvement

## 2018-03-08 LAB — BASIC METABOLIC PANEL
Anion gap: 6 (ref 5–15)
BUN: 6 mg/dL (ref 6–20)
CO2: 31 mmol/L (ref 22–32)
Calcium: 8.1 mg/dL — ABNORMAL LOW (ref 8.9–10.3)
Chloride: 100 mmol/L — ABNORMAL LOW (ref 101–111)
Creatinine, Ser: 0.72 mg/dL (ref 0.44–1.00)
GFR calc Af Amer: >60 mL/min (ref >60)
GFR calc non Af Amer: >60 mL/min (ref >60)
Glucose, Bld: 112 mg/dL — ABNORMAL HIGH (ref 65–99)
Potassium: 3 mmol/L — ABNORMAL LOW (ref 3.5–5.1)
Sodium: 137 mmol/L (ref 135–145)

## 2018-03-08 LAB — MAGNESIUM: Magnesium: 2.1 mg/dL (ref 1.7–2.4)

## 2018-03-08 MED ORDER — HYDROCODONE-ACETAMINOPHEN 5-325 MG PO TABS: 1.0000 | ORAL_TABLET | Freq: Four times a day (QID) | ORAL | Status: DC | PRN

## 2018-03-08 MED ORDER — POTASSIUM CHLORIDE CRYS ER 20 MEQ PO TBCR: 40.0000 meq | EXTENDED_RELEASE_TABLET | Freq: Every day | ORAL | 1 refills | Status: DC

## 2018-03-08 MED ORDER — HYDROCODONE-ACETAMINOPHEN 5-325 MG PO TABS: 1.0000 | ORAL_TABLET | Freq: Four times a day (QID) | ORAL | 0 refills | Status: DC | PRN

## 2018-03-08 NOTE — Progress Notes (Signed)
Ruth Pennington  A and O x 4. VSS. Pt tolerating diet well. No complaints of pain or nausea. IV removed intact, prescriptions given. Pt voiced understanding of discharge instructions with no further questions. Pt discharged via wheelchair with nurse tech.  Lynann Bologna MSN, RN-BC  Allergies as of 03/08/2018   No Known Allergies     Medication List    STOP taking these medications   atenolol-chlorthalidone 50-25 MG tablet Commonly known as:  TENORETIC     TAKE these medications   HYDROcodone-acetaminophen 5-325 MG tablet Commonly known as:  NORCO/VICODIN Take 1-2 tablets by mouth every 6 (six) hours as needed for moderate pain.   potassium chloride SA 20 MEQ tablet Commonly known as:  K-DUR,KLOR-CON Take 2 tablets (40 mEq total) by mouth daily. What changed:  how much to take       Vitals:   03/07/18 1955 03/08/18 0454  BP: 101/61 (!) 95/50  Pulse: 79 68  Resp: 20 (!) 8  Temp: 98.3 F (36.8 C) 97.6 F (36.4 C)  SpO2: 99% 99%

## 2018-03-08 NOTE — Discharge Instructions (Signed)
Appendicitis °The appendix is a tube that is shaped like a finger. It is connected to the large intestine. Appendicitis means that this tube is swollen (inflamed). Without treatment, the tube can tear (rupture). This can lead to a life-threatening infection. It can also cause you to have sores (abscesses). These sores hurt. °What are the causes? °This condition may be caused by something that blocks the appendix, such as: °· A ball of poop (stool). °· Lymph glands that are bigger than normal. ° °Sometimes, the cause is not known. °What are the signs or symptoms? °Symptoms of this condition include: °· Pain around the belly button (navel). °? The pain moves toward the lower right belly (abdomen). °? The pain can get worse with time. °? The pain can get worse if you cough. °? The pain can get worse if you move suddenly. °· Tenderness in the lower right belly. °· Feeling sick to your stomach (nauseous). °· Throwing up (vomiting). °· Not feeling hungry (loss of appetite). °· A fever. °· Having a hard time pooping (constipation). °· Watery poop (diarrhea). °· Not feeling well. ° °How is this treated? °Usually, this condition is treated by taking out the appendix (appendectomy). There are two ways that the appendix can be taken out: °· Open surgery. In this surgery, the appendix is taken out through a large cut (incision). The cut is made in the lower right belly. This surgery may be picked if: °? You have scars from another surgery. °? You have a bleeding condition. °? You are pregnant and will be having your baby soon. °? You have a condition that does not allow the other type of surgery. °· Laparoscopic surgery. In this surgery, the appendix is taken out through small cuts. Often, this surgery: °? Causes less pain. °? Causes fewer problems. °? Is easier to heal from. ° °If your appendix tears and a sore forms: °· A drain may be put into the sore. The drain will be used to get rid of fluid. °· You may get an antibiotic  medicine through an IV tube. °· Your appendix may or may not need to be taken out. ° °This information is not intended to replace advice given to you by your health care provider. Make sure you discuss any questions you have with your health care provider. °Document Released: 02/03/2012 Document Revised: 04/18/2016 Document Reviewed: 03/29/2015 °Elsevier Interactive Patient Education © 2018 [employer] Inc. ° °

## 2018-03-08 NOTE — Discharge Summary (Addendum)
  Patient ID: Ruth Pennington MRN: [id number] DOB/AGE: [birth year] 48 y.o.  Admit date: 03/06/2018 Discharge date: 03/08/2018   Discharge Diagnoses:  Appendicitis  Procedures: Lap. Appendectomy  Hospital Course: 47 year old female morbidly obese presented to the emergency room complaining of abdominal pain workup revealed evidence of acute appendicitis with significant inflammatory component.  She was taken to the operating room with Dr. Pat Patrick for a laparoscopic appendectomy.  She had an uneventful postoperative course.  The only issue was hypokalemia that was appropriately replaced.  She also had a hypomagnesemia that was replaced as well.  At the time of discharge her potassium had improved.  She does have a history of chronic hypokalemia and will increase her baseline dose.  Physical exam at discharge showed a female in no acute distress.  Awake and alert.  Abdomen: Soft minimal incisional tenderness.  No peritonitis.  Extremities: No edema well perfused.  Condition of the patient the time of discharge was stable  Disposition: Discharge disposition: 01-Home or Self Care        Allergies as of 03/08/2018   No Known Allergies     Medication List    STOP taking these medications   atenolol-chlorthalidone 50-25 MG tablet Commonly known as:  TENORETIC     TAKE these medications   HYDROcodone-acetaminophen 5-325 MG tablet Commonly known as:  NORCO/VICODIN Take 1-2 tablets by mouth every 6 (six) hours as needed for moderate pain.   potassium chloride SA 20 MEQ tablet Commonly known as:  K-DUR,KLOR-CON Take 2 tablets (40 mEq total) by mouth daily. What changed:  how much to take      Follow-up Information    Olean Ree, MD Follow up on 03/16/2018.   Specialty:  Surgery Contact information: [place] [id number] [phone]            Caroleen Hamman, MD FACS

## 2018-03-09 DIAGNOSIS — N200 Calculus of kidney: Secondary | ICD-10-CM | POA: Diagnosis not present

## 2018-03-09 DIAGNOSIS — C541 Malignant neoplasm of endometrium: Secondary | ICD-10-CM | POA: Diagnosis not present

## 2018-03-09 DIAGNOSIS — K3580 Unspecified acute appendicitis: Secondary | ICD-10-CM | POA: Diagnosis not present

## 2018-03-09 LAB — POCT I-STAT 4, (NA,K, GLUC, HGB,HCT)
Glucose, Bld: 121 mg/dL — ABNORMAL HIGH (ref 65–99)
HCT: 40 % (ref 36.0–46.0)
Hemoglobin: 13.6 g/dL (ref 12.0–15.0)
Potassium: 2.5 mmol/L — CL (ref 3.5–5.1)
Sodium: 138 mmol/L (ref 135–145)

## 2018-03-09 MED FILL — Piperacillin Sod-Tazobactam Sod For Inj 4.5 GM (4-0.5 GM): INTRAVENOUS | Qty: 4.5 | Status: AC

## 2018-03-10 ENCOUNTER — Other Ambulatory Visit: Payer: Self-pay

## 2018-03-10 DIAGNOSIS — C541 Malignant neoplasm of endometrium: Secondary | ICD-10-CM | POA: Diagnosis not present

## 2018-03-10 LAB — SURGICAL PATHOLOGY

## 2018-03-11 ENCOUNTER — Encounter: Payer: Self-pay | Admitting: Surgery

## 2018-03-11 LAB — CULTURE, BLOOD (ROUTINE X 2)
Culture: NO GROWTH
Culture: NO GROWTH
Special Requests: ADEQUATE

## 2018-03-16 ENCOUNTER — Ambulatory Visit (INDEPENDENT_AMBULATORY_CARE_PROVIDER_SITE_OTHER): Payer: BLUE CROSS/BLUE SHIELD | Admitting: Surgery

## 2018-03-16 ENCOUNTER — Encounter: Payer: Self-pay | Admitting: Surgery

## 2018-03-16 VITALS — BP 140/82 | HR 111 | Temp 98.0°F | Ht 66.0 in | Wt 309.0 lb

## 2018-03-16 DIAGNOSIS — Z09 Encounter for follow-up examination after completed treatment for conditions other than malignant neoplasm: Secondary | ICD-10-CM

## 2018-03-16 DIAGNOSIS — I10 Essential (primary) hypertension: Secondary | ICD-10-CM

## 2018-03-16 DIAGNOSIS — N200 Calculus of kidney: Secondary | ICD-10-CM

## 2018-03-16 NOTE — Progress Notes (Signed)
03/16/2018  HPI: Patient is s/p laparoscopic appendectomy with Dr. Pat Patrick on 03/06/18.  On CT scan, she was noted to have a suspicious mass in the pelvis, likely of left ovarian origin, as well as bilateral kidney stones without hydronephrosis.  Intraop, Dr. Pat Patrick reports that the mass appeared to be a large cyst and pictures were taken.  The patient has a follow up appointment with Dr. Erasmo Leventhal on 4/29 and has been scheduled for diagnostic laparoscopy on 04/01/18.  From the abdominal standpoint, the patient reports the incisions are healing well.  Denies any nausea, vomiting, or worsening abdominal pain. Has soreness at the incisions, but that has been improving.  During hospitalization, she was also noted to be hypotensive and hypokalemic and required potassium repletion.  Vital signs: BP 140/82   Pulse (!) 111   Temp 98 F (36.7 C) (Oral)   Ht 5\' 6"  (1.676 m)   Wt (!) 309 lb (140.2 kg)   BMI 49.87 kg/m    Physical Exam: Constitutional: No acute distress Abdomen:  Soft, nondistended, obese, nontender to palpation. Incisions are healing well, without any evidence of infection.  There is mild reactive erythema around the nylon sutures.  These were removed at bedside without complications and changed to steri strips.  Assessment/Plan: 47 yo female s/p laparoscopic appendectomy  --discussed pathology with patient -- appendicitis without evidence of malignancy. --patient still has until 04/03/18 of no heavy lifting or pushing of no more than 10-15 lbs.  Likely this will be extended further after her surgery with gyn/onc. --will send referral to urology for kidney stones --have given the patient the [facility] phone number to call to look for a PCP.  Her BP at home as been normal without any tachycardia.  Recommended that she continue avoiding the BP med and resume her normal home K supplementation. --patient may follow up with [place] on an as needed basis.   Melvyn Neth, [place]

## 2018-03-16 NOTE — Patient Instructions (Signed)

## 2018-03-23 DIAGNOSIS — C541 Malignant neoplasm of endometrium: Secondary | ICD-10-CM | POA: Diagnosis not present

## 2018-03-23 DIAGNOSIS — Z8639 Personal history of other endocrine, nutritional and metabolic disease: Secondary | ICD-10-CM | POA: Diagnosis not present

## 2018-03-23 DIAGNOSIS — R1900 Intra-abdominal and pelvic swelling, mass and lump, unspecified site: Secondary | ICD-10-CM | POA: Diagnosis not present

## 2018-04-01 DIAGNOSIS — N200 Calculus of kidney: Secondary | ICD-10-CM | POA: Diagnosis not present

## 2018-04-01 DIAGNOSIS — Z6841 Body Mass Index (BMI) 40.0 and over, adult: Secondary | ICD-10-CM | POA: Diagnosis not present

## 2018-04-01 DIAGNOSIS — R1900 Intra-abdominal and pelvic swelling, mass and lump, unspecified site: Secondary | ICD-10-CM | POA: Diagnosis not present

## 2018-04-01 DIAGNOSIS — R1310 Dysphagia, unspecified: Secondary | ICD-10-CM | POA: Diagnosis not present

## 2018-04-01 DIAGNOSIS — K219 Gastro-esophageal reflux disease without esophagitis: Secondary | ICD-10-CM | POA: Diagnosis not present

## 2018-04-01 DIAGNOSIS — Z9049 Acquired absence of other specified parts of digestive tract: Secondary | ICD-10-CM | POA: Diagnosis not present

## 2018-04-01 DIAGNOSIS — Z90710 Acquired absence of both cervix and uterus: Secondary | ICD-10-CM | POA: Diagnosis not present

## 2018-04-01 DIAGNOSIS — G8918 Other acute postprocedural pain: Secondary | ICD-10-CM | POA: Diagnosis not present

## 2018-04-01 DIAGNOSIS — C541 Malignant neoplasm of endometrium: Secondary | ICD-10-CM | POA: Diagnosis not present

## 2018-04-01 DIAGNOSIS — Z8542 Personal history of malignant neoplasm of other parts of uterus: Secondary | ICD-10-CM | POA: Diagnosis not present

## 2018-04-01 DIAGNOSIS — I10 Essential (primary) hypertension: Secondary | ICD-10-CM | POA: Diagnosis not present

## 2018-04-01 DIAGNOSIS — Z79899 Other long term (current) drug therapy: Secondary | ICD-10-CM | POA: Diagnosis not present

## 2018-04-01 DIAGNOSIS — D271 Benign neoplasm of left ovary: Secondary | ICD-10-CM | POA: Diagnosis not present

## 2018-04-01 DIAGNOSIS — Z87891 Personal history of nicotine dependence: Secondary | ICD-10-CM | POA: Diagnosis not present

## 2018-04-01 DIAGNOSIS — N736 Female pelvic peritoneal adhesions (postinfective): Secondary | ICD-10-CM | POA: Diagnosis not present

## 2018-04-01 DIAGNOSIS — Z5331 Laparoscopic surgical procedure converted to open procedure: Secondary | ICD-10-CM | POA: Diagnosis not present

## 2018-04-01 DIAGNOSIS — D270 Benign neoplasm of right ovary: Secondary | ICD-10-CM | POA: Diagnosis not present

## 2018-04-01 HISTORY — PX: OOPHORECTOMY: SHX86

## 2018-04-02 DIAGNOSIS — G8918 Other acute postprocedural pain: Secondary | ICD-10-CM | POA: Diagnosis not present

## 2018-04-03 DIAGNOSIS — G8918 Other acute postprocedural pain: Secondary | ICD-10-CM | POA: Diagnosis not present

## 2018-04-16 DIAGNOSIS — T8189XA Other complications of procedures, not elsewhere classified, initial encounter: Secondary | ICD-10-CM | POA: Diagnosis not present

## 2018-04-16 DIAGNOSIS — N9989 Other postprocedural complications and disorders of genitourinary system: Secondary | ICD-10-CM | POA: Diagnosis not present

## 2018-04-16 DIAGNOSIS — R1900 Intra-abdominal and pelvic swelling, mass and lump, unspecified site: Secondary | ICD-10-CM | POA: Diagnosis not present

## 2018-04-16 DIAGNOSIS — Z90722 Acquired absence of ovaries, bilateral: Secondary | ICD-10-CM | POA: Diagnosis not present

## 2018-04-16 DIAGNOSIS — Z87891 Personal history of nicotine dependence: Secondary | ICD-10-CM | POA: Diagnosis not present

## 2018-04-16 DIAGNOSIS — Z6841 Body Mass Index (BMI) 40.0 and over, adult: Secondary | ICD-10-CM | POA: Diagnosis not present

## 2018-04-16 DIAGNOSIS — T8141XA Infection following a procedure, superficial incisional surgical site, initial encounter: Secondary | ICD-10-CM | POA: Diagnosis not present

## 2018-04-16 DIAGNOSIS — Y838 Other surgical procedures as the cause of abnormal reaction of the patient, or of later complication, without mention of misadventure at the time of the procedure: Secondary | ICD-10-CM | POA: Diagnosis not present

## 2018-04-16 DIAGNOSIS — Z8542 Personal history of malignant neoplasm of other parts of uterus: Secondary | ICD-10-CM | POA: Diagnosis not present

## 2018-04-16 DIAGNOSIS — K659 Peritonitis, unspecified: Secondary | ICD-10-CM | POA: Diagnosis not present

## 2018-04-16 DIAGNOSIS — I10 Essential (primary) hypertension: Secondary | ICD-10-CM | POA: Diagnosis not present

## 2018-04-16 DIAGNOSIS — Z9049 Acquired absence of other specified parts of digestive tract: Secondary | ICD-10-CM | POA: Diagnosis not present

## 2018-04-16 DIAGNOSIS — Z90710 Acquired absence of both cervix and uterus: Secondary | ICD-10-CM | POA: Diagnosis not present

## 2018-04-19 DIAGNOSIS — K659 Peritonitis, unspecified: Secondary | ICD-10-CM | POA: Diagnosis not present

## 2018-04-19 DIAGNOSIS — Z90722 Acquired absence of ovaries, bilateral: Secondary | ICD-10-CM | POA: Diagnosis not present

## 2018-04-19 DIAGNOSIS — N9989 Other postprocedural complications and disorders of genitourinary system: Secondary | ICD-10-CM | POA: Diagnosis not present

## 2018-04-19 DIAGNOSIS — R1900 Intra-abdominal and pelvic swelling, mass and lump, unspecified site: Secondary | ICD-10-CM | POA: Diagnosis not present

## 2018-04-24 DIAGNOSIS — T8189XA Other complications of procedures, not elsewhere classified, initial encounter: Secondary | ICD-10-CM | POA: Diagnosis not present

## 2018-04-25 DIAGNOSIS — T8189XA Other complications of procedures, not elsewhere classified, initial encounter: Secondary | ICD-10-CM | POA: Diagnosis not present

## 2018-04-26 DIAGNOSIS — T8189XA Other complications of procedures, not elsewhere classified, initial encounter: Secondary | ICD-10-CM | POA: Diagnosis not present

## 2018-04-27 DIAGNOSIS — Z6841 Body Mass Index (BMI) 40.0 and over, adult: Secondary | ICD-10-CM | POA: Diagnosis not present

## 2018-04-27 DIAGNOSIS — I10 Essential (primary) hypertension: Secondary | ICD-10-CM | POA: Diagnosis not present

## 2018-04-27 DIAGNOSIS — C541 Malignant neoplasm of endometrium: Secondary | ICD-10-CM | POA: Diagnosis not present

## 2018-04-27 DIAGNOSIS — T8141XA Infection following a procedure, superficial incisional surgical site, initial encounter: Secondary | ICD-10-CM | POA: Diagnosis not present

## 2018-04-27 DIAGNOSIS — T8189XA Other complications of procedures, not elsewhere classified, initial encounter: Secondary | ICD-10-CM | POA: Diagnosis not present

## 2018-04-28 DIAGNOSIS — T8189XA Other complications of procedures, not elsewhere classified, initial encounter: Secondary | ICD-10-CM | POA: Diagnosis not present

## 2018-04-29 DIAGNOSIS — T8189XA Other complications of procedures, not elsewhere classified, initial encounter: Secondary | ICD-10-CM | POA: Diagnosis not present

## 2018-04-29 DIAGNOSIS — I10 Essential (primary) hypertension: Secondary | ICD-10-CM | POA: Diagnosis not present

## 2018-04-29 DIAGNOSIS — Z6841 Body Mass Index (BMI) 40.0 and over, adult: Secondary | ICD-10-CM | POA: Diagnosis not present

## 2018-04-29 DIAGNOSIS — C541 Malignant neoplasm of endometrium: Secondary | ICD-10-CM | POA: Diagnosis not present

## 2018-04-29 DIAGNOSIS — T8141XA Infection following a procedure, superficial incisional surgical site, initial encounter: Secondary | ICD-10-CM | POA: Diagnosis not present

## 2018-04-30 DIAGNOSIS — T8189XA Other complications of procedures, not elsewhere classified, initial encounter: Secondary | ICD-10-CM | POA: Diagnosis not present

## 2018-05-01 DIAGNOSIS — C541 Malignant neoplasm of endometrium: Secondary | ICD-10-CM | POA: Diagnosis not present

## 2018-05-01 DIAGNOSIS — I10 Essential (primary) hypertension: Secondary | ICD-10-CM | POA: Diagnosis not present

## 2018-05-01 DIAGNOSIS — Z6841 Body Mass Index (BMI) 40.0 and over, adult: Secondary | ICD-10-CM | POA: Diagnosis not present

## 2018-05-01 DIAGNOSIS — T8189XA Other complications of procedures, not elsewhere classified, initial encounter: Secondary | ICD-10-CM | POA: Diagnosis not present

## 2018-05-01 DIAGNOSIS — T8141XA Infection following a procedure, superficial incisional surgical site, initial encounter: Secondary | ICD-10-CM | POA: Diagnosis not present

## 2018-05-02 DIAGNOSIS — T8189XA Other complications of procedures, not elsewhere classified, initial encounter: Secondary | ICD-10-CM | POA: Diagnosis not present

## 2018-05-03 DIAGNOSIS — T8189XA Other complications of procedures, not elsewhere classified, initial encounter: Secondary | ICD-10-CM | POA: Diagnosis not present

## 2018-05-04 DIAGNOSIS — I10 Essential (primary) hypertension: Secondary | ICD-10-CM | POA: Diagnosis not present

## 2018-05-04 DIAGNOSIS — C541 Malignant neoplasm of endometrium: Secondary | ICD-10-CM | POA: Diagnosis not present

## 2018-05-04 DIAGNOSIS — Z6841 Body Mass Index (BMI) 40.0 and over, adult: Secondary | ICD-10-CM | POA: Diagnosis not present

## 2018-05-04 DIAGNOSIS — T8141XA Infection following a procedure, superficial incisional surgical site, initial encounter: Secondary | ICD-10-CM | POA: Diagnosis not present

## 2018-05-04 DIAGNOSIS — T8189XA Other complications of procedures, not elsewhere classified, initial encounter: Secondary | ICD-10-CM | POA: Diagnosis not present

## 2018-05-05 DIAGNOSIS — T8189XA Other complications of procedures, not elsewhere classified, initial encounter: Secondary | ICD-10-CM | POA: Diagnosis not present

## 2018-05-06 DIAGNOSIS — T8141XA Infection following a procedure, superficial incisional surgical site, initial encounter: Secondary | ICD-10-CM | POA: Diagnosis not present

## 2018-05-06 DIAGNOSIS — T8189XA Other complications of procedures, not elsewhere classified, initial encounter: Secondary | ICD-10-CM | POA: Diagnosis not present

## 2018-05-06 DIAGNOSIS — I10 Essential (primary) hypertension: Secondary | ICD-10-CM | POA: Diagnosis not present

## 2018-05-06 DIAGNOSIS — Z6841 Body Mass Index (BMI) 40.0 and over, adult: Secondary | ICD-10-CM | POA: Diagnosis not present

## 2018-05-06 DIAGNOSIS — C541 Malignant neoplasm of endometrium: Secondary | ICD-10-CM | POA: Diagnosis not present

## 2018-05-07 DIAGNOSIS — T8189XA Other complications of procedures, not elsewhere classified, initial encounter: Secondary | ICD-10-CM | POA: Diagnosis not present

## 2018-05-08 DIAGNOSIS — T8141XA Infection following a procedure, superficial incisional surgical site, initial encounter: Secondary | ICD-10-CM | POA: Diagnosis not present

## 2018-05-08 DIAGNOSIS — C541 Malignant neoplasm of endometrium: Secondary | ICD-10-CM | POA: Diagnosis not present

## 2018-05-08 DIAGNOSIS — Z6841 Body Mass Index (BMI) 40.0 and over, adult: Secondary | ICD-10-CM | POA: Diagnosis not present

## 2018-05-08 DIAGNOSIS — I10 Essential (primary) hypertension: Secondary | ICD-10-CM | POA: Diagnosis not present

## 2018-05-08 DIAGNOSIS — T8189XA Other complications of procedures, not elsewhere classified, initial encounter: Secondary | ICD-10-CM | POA: Diagnosis not present

## 2018-05-09 DIAGNOSIS — T8189XA Other complications of procedures, not elsewhere classified, initial encounter: Secondary | ICD-10-CM | POA: Diagnosis not present

## 2018-05-10 DIAGNOSIS — T8189XA Other complications of procedures, not elsewhere classified, initial encounter: Secondary | ICD-10-CM | POA: Diagnosis not present

## 2018-05-11 DIAGNOSIS — T8189XA Other complications of procedures, not elsewhere classified, initial encounter: Secondary | ICD-10-CM | POA: Diagnosis not present

## 2018-05-11 DIAGNOSIS — C541 Malignant neoplasm of endometrium: Secondary | ICD-10-CM | POA: Diagnosis not present

## 2018-05-12 DIAGNOSIS — T8189XA Other complications of procedures, not elsewhere classified, initial encounter: Secondary | ICD-10-CM | POA: Diagnosis not present

## 2018-05-13 DIAGNOSIS — T8189XA Other complications of procedures, not elsewhere classified, initial encounter: Secondary | ICD-10-CM | POA: Diagnosis not present

## 2018-05-13 DIAGNOSIS — Z6841 Body Mass Index (BMI) 40.0 and over, adult: Secondary | ICD-10-CM | POA: Diagnosis not present

## 2018-05-13 DIAGNOSIS — C541 Malignant neoplasm of endometrium: Secondary | ICD-10-CM | POA: Diagnosis not present

## 2018-05-13 DIAGNOSIS — T8141XA Infection following a procedure, superficial incisional surgical site, initial encounter: Secondary | ICD-10-CM | POA: Diagnosis not present

## 2018-05-13 DIAGNOSIS — I10 Essential (primary) hypertension: Secondary | ICD-10-CM | POA: Diagnosis not present

## 2018-05-14 DIAGNOSIS — T8189XA Other complications of procedures, not elsewhere classified, initial encounter: Secondary | ICD-10-CM | POA: Diagnosis not present

## 2018-05-15 DIAGNOSIS — T8141XA Infection following a procedure, superficial incisional surgical site, initial encounter: Secondary | ICD-10-CM | POA: Diagnosis not present

## 2018-05-15 DIAGNOSIS — I10 Essential (primary) hypertension: Secondary | ICD-10-CM | POA: Diagnosis not present

## 2018-05-15 DIAGNOSIS — Z6841 Body Mass Index (BMI) 40.0 and over, adult: Secondary | ICD-10-CM | POA: Diagnosis not present

## 2018-05-15 DIAGNOSIS — T8189XA Other complications of procedures, not elsewhere classified, initial encounter: Secondary | ICD-10-CM | POA: Diagnosis not present

## 2018-05-15 DIAGNOSIS — C541 Malignant neoplasm of endometrium: Secondary | ICD-10-CM | POA: Diagnosis not present

## 2018-05-16 DIAGNOSIS — T8189XA Other complications of procedures, not elsewhere classified, initial encounter: Secondary | ICD-10-CM | POA: Diagnosis not present

## 2018-05-17 DIAGNOSIS — T8189XA Other complications of procedures, not elsewhere classified, initial encounter: Secondary | ICD-10-CM | POA: Diagnosis not present

## 2018-05-18 DIAGNOSIS — T8189XA Other complications of procedures, not elsewhere classified, initial encounter: Secondary | ICD-10-CM | POA: Diagnosis not present

## 2018-05-18 DIAGNOSIS — Z6841 Body Mass Index (BMI) 40.0 and over, adult: Secondary | ICD-10-CM | POA: Diagnosis not present

## 2018-05-18 DIAGNOSIS — I10 Essential (primary) hypertension: Secondary | ICD-10-CM | POA: Diagnosis not present

## 2018-05-18 DIAGNOSIS — T8141XA Infection following a procedure, superficial incisional surgical site, initial encounter: Secondary | ICD-10-CM | POA: Diagnosis not present

## 2018-05-18 DIAGNOSIS — C541 Malignant neoplasm of endometrium: Secondary | ICD-10-CM | POA: Diagnosis not present

## 2018-05-19 DIAGNOSIS — T8189XA Other complications of procedures, not elsewhere classified, initial encounter: Secondary | ICD-10-CM | POA: Diagnosis not present

## 2018-05-20 DIAGNOSIS — T8189XA Other complications of procedures, not elsewhere classified, initial encounter: Secondary | ICD-10-CM | POA: Diagnosis not present

## 2018-05-20 DIAGNOSIS — Z6841 Body Mass Index (BMI) 40.0 and over, adult: Secondary | ICD-10-CM | POA: Diagnosis not present

## 2018-05-20 DIAGNOSIS — I10 Essential (primary) hypertension: Secondary | ICD-10-CM | POA: Diagnosis not present

## 2018-05-20 DIAGNOSIS — C541 Malignant neoplasm of endometrium: Secondary | ICD-10-CM | POA: Diagnosis not present

## 2018-05-20 DIAGNOSIS — T8141XA Infection following a procedure, superficial incisional surgical site, initial encounter: Secondary | ICD-10-CM | POA: Diagnosis not present

## 2018-05-21 DIAGNOSIS — T8189XA Other complications of procedures, not elsewhere classified, initial encounter: Secondary | ICD-10-CM | POA: Diagnosis not present

## 2018-05-22 DIAGNOSIS — Z6841 Body Mass Index (BMI) 40.0 and over, adult: Secondary | ICD-10-CM | POA: Diagnosis not present

## 2018-05-22 DIAGNOSIS — I10 Essential (primary) hypertension: Secondary | ICD-10-CM | POA: Diagnosis not present

## 2018-05-22 DIAGNOSIS — T8189XA Other complications of procedures, not elsewhere classified, initial encounter: Secondary | ICD-10-CM | POA: Diagnosis not present

## 2018-05-22 DIAGNOSIS — T8141XA Infection following a procedure, superficial incisional surgical site, initial encounter: Secondary | ICD-10-CM | POA: Diagnosis not present

## 2018-05-22 DIAGNOSIS — C541 Malignant neoplasm of endometrium: Secondary | ICD-10-CM | POA: Diagnosis not present

## 2018-05-23 DIAGNOSIS — T8189XA Other complications of procedures, not elsewhere classified, initial encounter: Secondary | ICD-10-CM | POA: Diagnosis not present

## 2018-05-24 DIAGNOSIS — T8189XA Other complications of procedures, not elsewhere classified, initial encounter: Secondary | ICD-10-CM | POA: Diagnosis not present

## 2018-05-25 DIAGNOSIS — C541 Malignant neoplasm of endometrium: Secondary | ICD-10-CM | POA: Diagnosis not present

## 2018-05-25 DIAGNOSIS — T8189XA Other complications of procedures, not elsewhere classified, initial encounter: Secondary | ICD-10-CM | POA: Diagnosis not present

## 2018-05-25 DIAGNOSIS — I10 Essential (primary) hypertension: Secondary | ICD-10-CM | POA: Diagnosis not present

## 2018-05-25 DIAGNOSIS — T8141XA Infection following a procedure, superficial incisional surgical site, initial encounter: Secondary | ICD-10-CM | POA: Diagnosis not present

## 2018-05-25 DIAGNOSIS — Z6841 Body Mass Index (BMI) 40.0 and over, adult: Secondary | ICD-10-CM | POA: Diagnosis not present

## 2018-05-26 DIAGNOSIS — T8189XA Other complications of procedures, not elsewhere classified, initial encounter: Secondary | ICD-10-CM | POA: Diagnosis not present

## 2018-05-27 DIAGNOSIS — I10 Essential (primary) hypertension: Secondary | ICD-10-CM | POA: Diagnosis not present

## 2018-05-27 DIAGNOSIS — C541 Malignant neoplasm of endometrium: Secondary | ICD-10-CM | POA: Diagnosis not present

## 2018-05-27 DIAGNOSIS — Z6841 Body Mass Index (BMI) 40.0 and over, adult: Secondary | ICD-10-CM | POA: Diagnosis not present

## 2018-05-27 DIAGNOSIS — T8141XA Infection following a procedure, superficial incisional surgical site, initial encounter: Secondary | ICD-10-CM | POA: Diagnosis not present

## 2018-05-27 DIAGNOSIS — T8189XA Other complications of procedures, not elsewhere classified, initial encounter: Secondary | ICD-10-CM | POA: Diagnosis not present

## 2018-05-28 DIAGNOSIS — T8189XA Other complications of procedures, not elsewhere classified, initial encounter: Secondary | ICD-10-CM | POA: Diagnosis not present

## 2018-05-29 DIAGNOSIS — Z6841 Body Mass Index (BMI) 40.0 and over, adult: Secondary | ICD-10-CM | POA: Diagnosis not present

## 2018-05-29 DIAGNOSIS — C541 Malignant neoplasm of endometrium: Secondary | ICD-10-CM | POA: Diagnosis not present

## 2018-05-29 DIAGNOSIS — I10 Essential (primary) hypertension: Secondary | ICD-10-CM | POA: Diagnosis not present

## 2018-05-29 DIAGNOSIS — T8141XA Infection following a procedure, superficial incisional surgical site, initial encounter: Secondary | ICD-10-CM | POA: Diagnosis not present

## 2018-05-29 DIAGNOSIS — T8189XA Other complications of procedures, not elsewhere classified, initial encounter: Secondary | ICD-10-CM | POA: Diagnosis not present

## 2018-06-01 DIAGNOSIS — T8141XA Infection following a procedure, superficial incisional surgical site, initial encounter: Secondary | ICD-10-CM | POA: Diagnosis not present

## 2018-06-03 DIAGNOSIS — I10 Essential (primary) hypertension: Secondary | ICD-10-CM | POA: Diagnosis not present

## 2018-06-03 DIAGNOSIS — Z6841 Body Mass Index (BMI) 40.0 and over, adult: Secondary | ICD-10-CM | POA: Diagnosis not present

## 2018-06-03 DIAGNOSIS — T8141XA Infection following a procedure, superficial incisional surgical site, initial encounter: Secondary | ICD-10-CM | POA: Diagnosis not present

## 2018-06-03 DIAGNOSIS — C541 Malignant neoplasm of endometrium: Secondary | ICD-10-CM | POA: Diagnosis not present

## 2018-06-08 DIAGNOSIS — C541 Malignant neoplasm of endometrium: Secondary | ICD-10-CM | POA: Diagnosis not present

## 2018-06-10 DIAGNOSIS — C541 Malignant neoplasm of endometrium: Secondary | ICD-10-CM | POA: Diagnosis not present

## 2018-06-10 DIAGNOSIS — I10 Essential (primary) hypertension: Secondary | ICD-10-CM | POA: Diagnosis not present

## 2018-06-10 DIAGNOSIS — Z6841 Body Mass Index (BMI) 40.0 and over, adult: Secondary | ICD-10-CM | POA: Diagnosis not present

## 2018-06-10 DIAGNOSIS — T8141XA Infection following a procedure, superficial incisional surgical site, initial encounter: Secondary | ICD-10-CM | POA: Diagnosis not present

## 2018-10-12 DIAGNOSIS — I10 Essential (primary) hypertension: Secondary | ICD-10-CM | POA: Diagnosis not present

## 2018-10-12 DIAGNOSIS — K219 Gastro-esophageal reflux disease without esophagitis: Secondary | ICD-10-CM | POA: Diagnosis not present

## 2018-10-12 DIAGNOSIS — C541 Malignant neoplasm of endometrium: Secondary | ICD-10-CM | POA: Diagnosis not present

## 2018-10-29 ENCOUNTER — Encounter: Payer: Self-pay | Admitting: Family Medicine

## 2018-10-29 ENCOUNTER — Ambulatory Visit (INDEPENDENT_AMBULATORY_CARE_PROVIDER_SITE_OTHER): Payer: BLUE CROSS/BLUE SHIELD | Admitting: Family Medicine

## 2018-10-29 VITALS — BP 112/76 | HR 83 | Temp 98.6°F | Ht 65.0 in | Wt 294.0 lb

## 2018-10-29 DIAGNOSIS — I10 Essential (primary) hypertension: Secondary | ICD-10-CM | POA: Diagnosis not present

## 2018-10-29 DIAGNOSIS — Z90722 Acquired absence of ovaries, bilateral: Secondary | ICD-10-CM | POA: Diagnosis not present

## 2018-10-29 DIAGNOSIS — Z23 Encounter for immunization: Secondary | ICD-10-CM

## 2018-10-29 DIAGNOSIS — Z1331 Encounter for screening for depression: Secondary | ICD-10-CM

## 2018-10-29 MED ORDER — LISINOPRIL 10 MG PO TABS: 10.0000 mg | ORAL_TABLET | Freq: Every day | ORAL | 2 refills | Status: DC

## 2018-10-29 NOTE — Assessment & Plan Note (Signed)
Given need for potassium supplement, discussed trying alternative medication. Will d/c previous meds and start Lisinopril today. Return in 4 weeks for labs and bp check

## 2018-10-29 NOTE — Progress Notes (Signed)
   Subjective:     Ruth Pennington is a 47 y.o. female presenting for Establish Care (previous PCP with [facility])     HPI  #s/p bilateral oophorectomy - with some menopausal symptoms - but managing   #HTN - had been off medication since April - then started having HA and BP 140/90 - restarted her old BP   #obesity - working on weight loss - stopped working last year - got up to walking 3 miles per day - eliminating carbs - has not been walking as much, but hoping to get back to this - feels like she is doing well managing her diet  Review of Systems  Constitutional: Negative for chills and fever.  Respiratory: Negative for chest tightness and shortness of breath.   Cardiovascular: Negative for chest pain and leg swelling.  Neurological: Negative for dizziness and headaches.     Social History   Tobacco Use  Smoking Status Never Smoker  Smokeless Tobacco Never Used        Objective:    BP Readings from Last 3 Encounters:  10/29/18 112/76  03/16/18 140/82  03/08/18 (!) 95/50   Wt Readings from Last 3 Encounters:  10/29/18 294 lb (133.4 kg)  03/16/18 (!) 309 lb (140.2 kg)  03/06/18 (!) 315 lb 6.4 oz (143.1 kg)    BP 112/76   Pulse 83   Temp 98.6 F (37 C)   Ht 5\' 5"  (1.651 m)   Wt 294 lb (133.4 kg)   SpO2 95%   BMI 48.92 kg/m    Physical Exam  Constitutional: She appears well-developed and well-nourished. No distress.  HENT:  Right Ear: External ear normal.  Left Ear: External ear normal.  Nose: Nose normal.  Eyes: Conjunctivae and EOM are normal.  Neck: Neck supple.  Cardiovascular: Normal rate and regular rhythm.  No murmur heard. Pulmonary/Chest: Effort normal and breath sounds normal. No respiratory distress.  Abdominal:  Large vertical scar in the lower abdomen which appears well healed w/o erythema  Neurological: She is alert.  Skin: Skin is warm and dry. Capillary refill takes less than 2 seconds. She is not  diaphoretic.  Psychiatric: She has a normal mood and affect.          Assessment & Plan:   Problem List Items Addressed This Visit      Cardiovascular and Mediastinum   Essential hypertension - Primary    Given need for potassium supplement, discussed trying alternative medication. Will d/c previous meds and start Lisinopril today. Return in 4 weeks for labs and bp check       Relevant Medications   lisinopril (PRINIVIL,ZESTRIL) 10 MG tablet     Other   Obesity, morbid ([place])    Per report normal Hgb A1c. Motivated to exercise and lose weight. Declined referrals today. Will follow-up outside records for labs and obtain additional labs prn. Cont to encourage lifestyle changes and wt loss          Return in about 4 weeks (around 11/26/2018) for blood pressure check and labs.  Lesleigh Noe, MD

## 2018-10-29 NOTE — Assessment & Plan Note (Signed)
Per report normal Hgb A1c. Motivated to exercise and lose weight. Declined referrals today. Will follow-up outside records for labs and obtain additional labs prn. Cont to encourage lifestyle changes and wt loss

## 2018-10-29 NOTE — Patient Instructions (Signed)
Stop the other blood pressure medication   Start Lisinopril 10 mg  You can take it at night or morning with a glass of water

## 2018-11-12 ENCOUNTER — Telehealth: Payer: Self-pay

## 2018-11-12 NOTE — Telephone Encounter (Signed)
FYI: I have reached out to patient's previous PCP and gyn and neither offices have Tdap or Td record. Need to get this updated on next visit if Dr.Cody is agreeable.

## 2018-11-24 ENCOUNTER — Encounter: Payer: Self-pay | Admitting: Family Medicine

## 2018-11-24 ENCOUNTER — Ambulatory Visit (INDEPENDENT_AMBULATORY_CARE_PROVIDER_SITE_OTHER): Payer: BLUE CROSS/BLUE SHIELD | Admitting: Family Medicine

## 2018-11-24 VITALS — BP 110/64 | HR 90 | Temp 98.4°F | Ht 65.0 in | Wt 300.8 lb

## 2018-11-24 DIAGNOSIS — Z23 Encounter for immunization: Secondary | ICD-10-CM | POA: Diagnosis not present

## 2018-11-24 DIAGNOSIS — I10 Essential (primary) hypertension: Secondary | ICD-10-CM

## 2018-11-24 LAB — LIPID PANEL
Cholesterol: 208 mg/dL — ABNORMAL HIGH (ref 0–200)
HDL: 51.2 mg/dL (ref >39.00)
LDL Cholesterol: 125 mg/dL — ABNORMAL HIGH (ref 0–99)
NonHDL: 156.42
Total CHOL/HDL Ratio: 4
Triglycerides: 159 mg/dL — ABNORMAL HIGH (ref 0.0–149.0)
VLDL: 31.8 mg/dL (ref 0.0–40.0)

## 2018-11-24 LAB — BASIC METABOLIC PANEL
BUN: 14 mg/dL (ref 6–23)
CO2: 29 mEq/L (ref 19–32)
Calcium: 9.8 mg/dL (ref 8.4–10.5)
Chloride: 100 mEq/L (ref 96–112)
Creatinine, Ser: 0.78 mg/dL (ref 0.40–1.20)
GFR: 83.68 mL/min (ref >60.00)
Glucose, Bld: 105 mg/dL — ABNORMAL HIGH (ref 70–99)
Potassium: 4.4 mEq/L (ref 3.5–5.1)
Sodium: 136 mEq/L (ref 135–145)

## 2018-11-24 NOTE — Progress Notes (Signed)
   Subjective:     Ruth Pennington is a 47 y.o. female presenting for Hypertension (4 week follow up.)     HPI   #HTN - taking lisinopril - no headache - has noticed a weight gain in the last month - but not sure if that is the holidays - has noticed some water weight gain with stopping the  - denies lightheadedness/dizziness   Hx of racing heart rate - has not gotten worse  Weight gain - feels her scar with more pain    Review of Systems  Eyes: Negative for visual disturbance.  Respiratory: Negative for shortness of breath.   Cardiovascular: Negative for chest pain, palpitations and leg swelling.  Neurological: Negative for headaches.   10/29/2018: Clinic - BP med change - from diuretic + potassium to lisinopril.   Social History   Tobacco Use  Smoking Status Never Smoker  Smokeless Tobacco Never Used        Objective:    BP Readings from Last 3 Encounters:  11/24/18 110/64  10/29/18 112/76  03/16/18 140/82   Wt Readings from Last 3 Encounters:  11/24/18 (!) 300 lb 12 oz (136.4 kg)  10/29/18 294 lb (133.4 kg)  03/16/18 (!) 309 lb (140.2 kg)    BP 110/64   Pulse 90   Temp 98.4 F (36.9 C)   Ht 5\' 5"  (1.651 m)   Wt (!) 300 lb 12 oz (136.4 kg)   SpO2 98%   BMI 50.05 kg/m    Physical Exam Constitutional:      General: She is not in acute distress.    Appearance: She is well-developed. She is not diaphoretic.  HENT:     Right Ear: External ear normal.     Left Ear: External ear normal.     Nose: Nose normal.  Eyes:     Conjunctiva/sclera: Conjunctivae normal.  Neck:     Musculoskeletal: Neck supple.  Cardiovascular:     Rate and Rhythm: Normal rate and regular rhythm.     Heart sounds: No murmur.  Pulmonary:     Effort: Pulmonary effort is normal.     Breath sounds: Normal breath sounds. No wheezing or rales.  Skin:    General: Skin is warm and dry.     Capillary Refill: Capillary refill takes less than 2 seconds.  Neurological:   Mental Status: She is alert. Mental status is at baseline.  Psychiatric:        Mood and Affect: Mood normal.        Behavior: Behavior normal.      Outside Hgb A1c 5.0 in 02/2018     Assessment & Plan:   Problem List Items Addressed This Visit      Cardiovascular and Mediastinum   Essential hypertension - Primary    BP at goal on lisinopril. Will screen for hyperlipidemia.       Relevant Orders   Basic metabolic panel   Lipid panel     Other   Obesity, morbid ([place])    Some weight gain with medication change and the holidays. Pt motivated to get back to dietary changes. Continue to monitor       Other Visit Diagnoses    Need for Tdap vaccination       Relevant Orders   Tdap vaccine greater than or equal to 7yo IM       Return in about 6 months (around 05/25/2019).  Lesleigh Noe, MD

## 2018-11-24 NOTE — Assessment & Plan Note (Signed)
Some weight gain with medication change and the holidays. Pt motivated to get back to dietary changes. Continue to monitor

## 2018-11-24 NOTE — Patient Instructions (Addendum)
#  Blood pressure  - Glad your blood pressure still looks good - we will get labs today  #Work on exercise and diet in the new year

## 2018-11-24 NOTE — Assessment & Plan Note (Signed)
BP at goal on lisinopril. Will screen for hyperlipidemia.

## 2019-01-16 ENCOUNTER — Encounter: Payer: Self-pay | Admitting: Family Medicine

## 2019-01-16 ENCOUNTER — Other Ambulatory Visit: Payer: Self-pay | Admitting: Family Medicine

## 2019-01-16 DIAGNOSIS — I10 Essential (primary) hypertension: Secondary | ICD-10-CM

## 2019-04-10 ENCOUNTER — Other Ambulatory Visit: Payer: Self-pay | Admitting: Family Medicine

## 2019-04-10 DIAGNOSIS — I10 Essential (primary) hypertension: Secondary | ICD-10-CM

## 2019-04-11 ENCOUNTER — Encounter: Payer: Self-pay | Admitting: Family Medicine

## 2019-04-12 DIAGNOSIS — C541 Malignant neoplasm of endometrium: Secondary | ICD-10-CM | POA: Diagnosis not present

## 2019-05-19 ENCOUNTER — Other Ambulatory Visit: Payer: Self-pay | Admitting: Family Medicine

## 2019-05-19 NOTE — Progress Notes (Signed)
Patient not due for any labs. Checked glucose, kidney function, and cholesterol in 11/24/2018.   Ok to cancel lab appointment, can just come to annual.

## 2019-05-24 ENCOUNTER — Other Ambulatory Visit: Payer: BLUE CROSS/BLUE SHIELD

## 2019-05-26 ENCOUNTER — Ambulatory Visit (INDEPENDENT_AMBULATORY_CARE_PROVIDER_SITE_OTHER): Payer: BC Managed Care – PPO | Admitting: Family Medicine

## 2019-05-26 ENCOUNTER — Encounter: Payer: Self-pay | Admitting: Family Medicine

## 2019-05-26 ENCOUNTER — Other Ambulatory Visit: Payer: Self-pay

## 2019-05-26 VITALS — BP 110/72 | HR 90 | Temp 98.9°F | Resp 20 | Ht 65.5 in | Wt 314.8 lb

## 2019-05-26 DIAGNOSIS — Z0000 Encounter for general adult medical examination without abnormal findings: Secondary | ICD-10-CM

## 2019-05-26 DIAGNOSIS — Z8542 Personal history of malignant neoplasm of other parts of uterus: Secondary | ICD-10-CM | POA: Insufficient documentation

## 2019-05-26 NOTE — Patient Instructions (Signed)

## 2019-05-26 NOTE — Progress Notes (Signed)
Annual Exam   Chief Complaint:  Chief Complaint  Patient presents with  . Annual Exam    History of Present Illness:  Ms. Ruth Pennington is a 48 y.o. No obstetric history on file. who LMP was No LMP recorded. Patient has had a hysterectomy., presents today for her annual examination.      Nutrition She does get adequate calcium and Vitamin D in her diet. Diet: drinks milk and eats and spinach - has not been great lately - baking more with her kids  Safety The patient wears seatbelts: yes.     The patient feels safe at home and in their relationships: yes.  She is single partner, contraception - status post hysterectomy.   Cancer Screening:   Last Pap:   No hx of abnormal, no longer has a cervix    Breast Cancer Screening There is no FH of breast cancer. There is no FH of ovarian cancer. BRCA screening No.  Discussed that for average risk women between age 28-49 screening may reduce the risk of breast cancer death, however, at a lower rate than those over age 70. And that the the false-positive rates resulting in unnecessary biopsies with more screening is higher. The balance of benefits vs harms likely improves as you progress through your 40s. The patient does not want a mammogram this year.    Weight Wt Readings from Last 3 Encounters:  05/26/19 (!) 314 lb 12 oz (142.8 kg)  11/24/18 (!) 300 lb 12 oz (136.4 kg)  10/29/18 294 lb (133.4 kg)   Patient has very high BMI  BMI Readings from Last 1 Encounters:  05/26/19 51.58 kg/m     Chronic disease screening Blood pressure monitoring:  BP Readings from Last 3 Encounters:  05/26/19 110/72  11/24/18 110/64  10/29/18 112/76    Lipid Monitoring: Indication for screening: age >3, obesity, diabetes, family hx, CV risk factors.  Lipid screening: Yes  Lab Results  Component Value Date   CHOL 208 (H) 11/24/2018   HDL 51.20 11/24/2018   LDLCALC 125 (H) 11/24/2018   TRIG 159.0 (H) 11/24/2018   CHOLHDL 4 11/24/2018      Diabetes Screening: overweight, family hx, PCOS, hx of gestational diabetes, at risk ethnicity Diabetes Screening screening: Not Indicated  No results found for: HGBA1C - outside lab was 5.0   Past Medical History:  Diagnosis Date  . Endometrial cancer ([place])   . Endometrial cancer ([place])   . Hypertension   . Hypokalemia   . Obesity, morbid ([place]) 01/09/2015    Past Surgical History:  Procedure Laterality Date  . ABDOMINAL HYSTERECTOMY     cervix removed   . APPENDECTOMY    . CHOLECYSTECTOMY    . LAPAROSCOPIC APPENDECTOMY N/A 03/06/2018   Procedure: APPENDECTOMY LAPAROSCOPIC;  Surgeon: Jeanie Cooks, MD;  Location: [facility] ORS;  Service: General;  Laterality: N/A;  . OOPHORECTOMY Bilateral 04/01/2018    Prior to Admission medications   Medication Sig Start Date End Date Taking? Authorizing Provider  acetaminophen (TYLENOL) 500 MG tablet Take by mouth. 04/04/18  Yes [provider]  ibuprofen (ADVIL,MOTRIN) 200 MG tablet Take by mouth.   Yes [provider]  lisinopril (ZESTRIL) 10 MG tablet TAKE 1 TABLET(10 MG) BY MOUTH DAILY 04/12/19  Yes Lesleigh Noe, MD    No Known Allergies  Gynecologic History: No LMP recorded. Patient has had a hysterectomy.  Obstetric History: No obstetric history on file.  Social History   Socioeconomic History  . Marital  status: Married    Spouse name: Karlton Lemon  . Number of children: 5  . Years of education: college  . Highest education level: Not on file  Occupational History  . Not on file  Social Needs  . Financial resource strain: Not hard at all  . Food insecurity    Worry: Not on file    Inability: Not on file  . Transportation needs    Medical: Not on file    Non-medical: Not on file  Tobacco Use  . Smoking status: Never Smoker  . Smokeless tobacco: Never Used  Substance and Sexual Activity  . Alcohol use: Yes    Comment: occassionally - 3 times a year  . Drug use: Never  . Sexual activity: Yes     Birth control/protection: Surgical  Lifestyle  . Physical activity    Days per week: Not on file    Minutes per session: Not on file  . Stress: Not on file  Relationships  . Social Herbalist on phone: Not on file    Gets together: Not on file    Attends religious service: Not on file    Active member of club or organization: Not on file    Attends meetings of clubs or organizations: Not on file    Relationship status: Not on file  . Intimate partner violence    Fear of current or ex partner: Not on file    Emotionally abused: Not on file    Physically abused: Not on file    Forced sexual activity: Not on file  Other Topics Concern  . 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 Hypertension Mother   . Aortic stenosis Mother   . Other Mother        Trigeminal neuralgia  . Glaucoma Father   . Hypertension Father   . Diabetes Father   . Prostate cancer Father   . Prostate cancer Brother     Review of Systems  Constitutional: Negative for chills and fever.  HENT: Negative for congestion and sore throat.   Eyes: Negative for blurred vision and double vision.  Respiratory: Negative for cough and shortness of breath.   Cardiovascular: Negative for chest pain, palpitations and leg swelling.  Gastrointestinal: Positive for heartburn. Negative for nausea and vomiting.  Genitourinary: Negative.   Musculoskeletal: Negative for myalgias.  Skin: Negative for itching and rash.  Neurological: Negative for headaches.  Endo/Heme/Allergies: Negative for environmental allergies.  Psychiatric/Behavioral: Negative for depression. The patient is not nervous/anxious.      Physical Exam BP 110/72    Pulse 90   Temp 98.9 F (37.2 C)   Resp 20   Ht 5' 5.5" (1.664 m)   Wt (!) 314 lb 12 oz (142.8 kg)   SpO2 98%   BMI 51.58 kg/m    BP Readings from Last 3 Encounters:  05/26/19 110/72  11/24/18 110/64  10/29/18 112/76      Physical Exam Constitutional:      General: She is not in acute distress.    Appearance: She is well-developed. She is not diaphoretic.  HENT:     Head: Normocephalic  and atraumatic.     Right Ear: External ear normal.     Left Ear: External ear normal.     Nose: Nose normal.  Eyes:     General: No scleral icterus.    Conjunctiva/sclera: Conjunctivae normal.  Neck:     Musculoskeletal: Neck supple.  Cardiovascular:     Rate and Rhythm: Normal rate and regular rhythm.     Heart sounds: No murmur.  Pulmonary:     Effort: Pulmonary effort is normal. No respiratory distress.     Breath sounds: Normal breath sounds. No wheezing.  Abdominal:     General: A surgical scar is present. Bowel sounds are normal. There is no distension.     Palpations: Abdomen is soft. There is no mass.     Tenderness: There is no abdominal tenderness. There is no guarding or rebound.  Musculoskeletal: Normal range of motion.  Lymphadenopathy:     Cervical: No cervical adenopathy.  Skin:    General: Skin is warm and dry.     Capillary Refill: Capillary refill takes less than 2 seconds.  Neurological:     Mental Status: She is alert and oriented to person, place, and time.     Deep Tendon Reflexes: Reflexes normal.  Psychiatric:        Mood and Affect: Mood normal.        Behavior: Behavior normal.        Thought Content: Thought content normal.        Results: AUDIT Questionnaire (screen for alcoholism): low risk  PHQ-9:    Office Visit from 10/29/2018 in [place] at [facility] Total Score  4        Assessment: 48 y.o. No obstetric history on file. female here for routine annual physical examination.  Plan: Problem List Items Addressed  This Visit      Other   History of endometrial cancer    Other Visit Diagnoses    Annual physical exam    -  Primary      Screening: -- Blood pressure screen stable on lisinopril -- Mammogram - not due -- Weight screening: obese: discussed management options, including lifestyle, dietary, and exercise. -- Depression screening (PHQ-9): negative -- Nutrition: normal -- cholesterol screening: not due for screening -- osteoporosis screening: not due -- tobacco screening: not using -- alcohol screening: low-risk usage. -- family history of breast cancer screening: done. not at high risk. -- no evidence of domestic violence or intimate partner violence. -- pap smear not collected per ASCCP guidelines -- flu vaccine advised for fall -- TDAP  - up to date   Lesleigh Noe

## 2019-05-27 ENCOUNTER — Encounter: Payer: Self-pay | Admitting: Family Medicine

## 2019-06-25 ENCOUNTER — Encounter: Payer: Self-pay | Admitting: Family Medicine

## 2019-08-26 ENCOUNTER — Ambulatory Visit (INDEPENDENT_AMBULATORY_CARE_PROVIDER_SITE_OTHER): Payer: BC Managed Care – PPO

## 2019-08-26 DIAGNOSIS — Z23 Encounter for immunization: Secondary | ICD-10-CM | POA: Diagnosis not present

## 2019-09-16 ENCOUNTER — Other Ambulatory Visit: Payer: Self-pay | Admitting: Family Medicine

## 2019-09-16 DIAGNOSIS — I10 Essential (primary) hypertension: Secondary | ICD-10-CM

## 2020-01-03 ENCOUNTER — Encounter: Payer: Self-pay | Admitting: Family Medicine

## 2020-01-07 ENCOUNTER — Emergency Department
Admission: EM | Admit: 2020-01-07 | Discharge: 2020-01-07 | Disposition: A | Discharge status: Home / Self Care | Payer: BC Managed Care – PPO | Attending: Student | Admitting: Student

## 2020-01-07 ENCOUNTER — Emergency Department: Payer: BC Managed Care – PPO

## 2020-01-07 DIAGNOSIS — I10 Essential (primary) hypertension: Secondary | ICD-10-CM | POA: Insufficient documentation

## 2020-01-07 DIAGNOSIS — Z79899 Other long term (current) drug therapy: Secondary | ICD-10-CM | POA: Insufficient documentation

## 2020-01-07 DIAGNOSIS — K118 Other diseases of salivary glands: Secondary | ICD-10-CM | POA: Diagnosis not present

## 2020-01-07 DIAGNOSIS — K1121 Acute sialoadenitis: Secondary | ICD-10-CM | POA: Diagnosis not present

## 2020-01-07 LAB — CBC WITH DIFFERENTIAL/PLATELET
Abs Immature Granulocytes: 0.03 10*3/uL (ref 0.00–0.07)
Basophils Absolute: 0.1 10*3/uL (ref 0.0–0.1)
Basophils Relative: 1 %
Eosinophils Absolute: 0.1 10*3/uL (ref 0.0–0.5)
Eosinophils Relative: 1 %
HCT: 43.3 % (ref 36.0–46.0)
Hemoglobin: 14.6 g/dL (ref 12.0–15.0)
Immature Granulocytes: 0 %
Lymphocytes Relative: 32 %
Lymphs Abs: 2.5 10*3/uL (ref 0.7–4.0)
MCH: 30.5 pg (ref 26.0–34.0)
MCHC: 33.7 g/dL (ref 30.0–36.0)
MCV: 90.4 fL (ref 80.0–100.0)
Monocytes Absolute: 0.8 10*3/uL (ref 0.1–1.0)
Monocytes Relative: 10 %
Neutro Abs: 4.5 10*3/uL (ref 1.7–7.7)
Neutrophils Relative %: 56 %
Platelets: 266 10*3/uL (ref 150–400)
RBC: 4.79 MIL/uL (ref 3.87–5.11)
RDW: 12.5 % (ref 11.5–15.5)
WBC: 8 10*3/uL (ref 4.0–10.5)
nRBC: 0 % (ref 0.0–0.2)

## 2020-01-07 LAB — COMPREHENSIVE METABOLIC PANEL
ALT: 34 U/L (ref 0–44)
AST: 25 U/L (ref 15–41)
Albumin: 4 g/dL (ref 3.5–5.0)
Alkaline Phosphatase: 109 U/L (ref 38–126)
Anion gap: 9 (ref 5–15)
BUN: 15 mg/dL (ref 6–20)
CO2: 26 mmol/L (ref 22–32)
Calcium: 9.4 mg/dL (ref 8.9–10.3)
Chloride: 103 mmol/L (ref 98–111)
Creatinine, Ser: 0.82 mg/dL (ref 0.44–1.00)
GFR calc Af Amer: >60 mL/min (ref >60)
GFR calc non Af Amer: >60 mL/min (ref >60)
Glucose, Bld: 117 mg/dL — ABNORMAL HIGH (ref 70–99)
Potassium: 3.8 mmol/L (ref 3.5–5.1)
Sodium: 138 mmol/L (ref 135–145)
Total Bilirubin: 0.6 mg/dL (ref 0.3–1.2)
Total Protein: 7.4 g/dL (ref 6.5–8.1)

## 2020-01-07 IMAGING — CT CT NECK W/ CM
4 series · 15 of 33 positions shown, 18 images · IV contrast (omnipaque)
Comparison: None.

CLINICAL DATA: Right facial swelling

EXAM:
CT NECK WITH CONTRAST
TECHNIQUE: Multidetector CT imaging of the neck was performed using the
standard protocol following the bolus administration of intravenous
contrast.
CONTRAST:  75mL OMNIPAQUE IOHEXOL 300 MG/ML  SOLN

[Series 2: axial neck · axial · 0.53mm/px · z∈[+292,+436]mm · 5 of 110 slices shown, 7 images]
[im 19/110  soft-tissue]
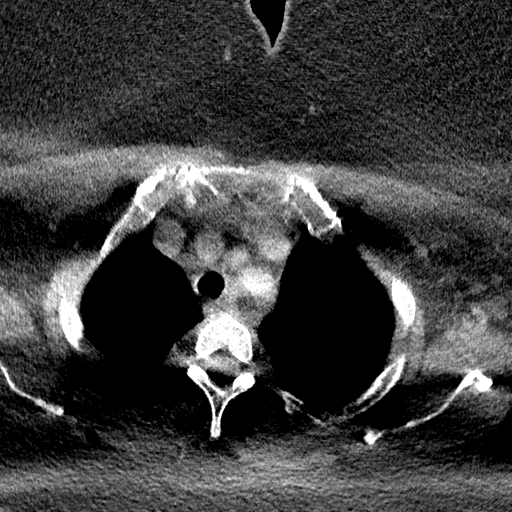
[im 19/110  bone]
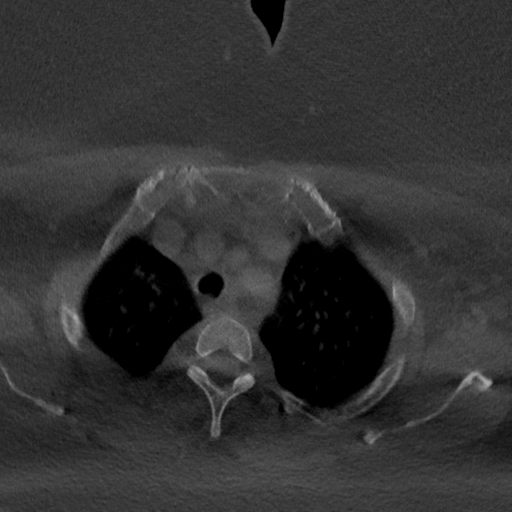
[im 37/110  bone]
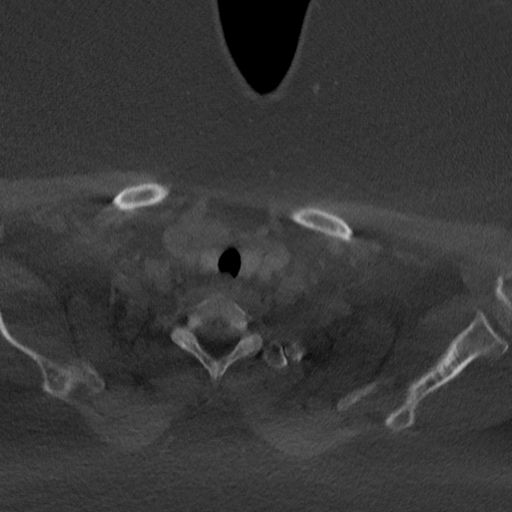
[im 55/110  bone]
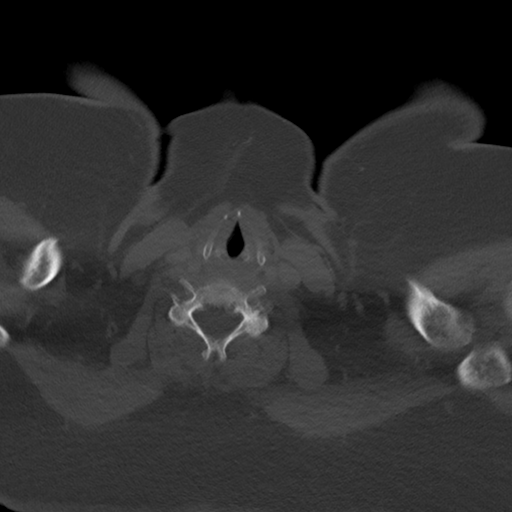
[im 73/110  bone]
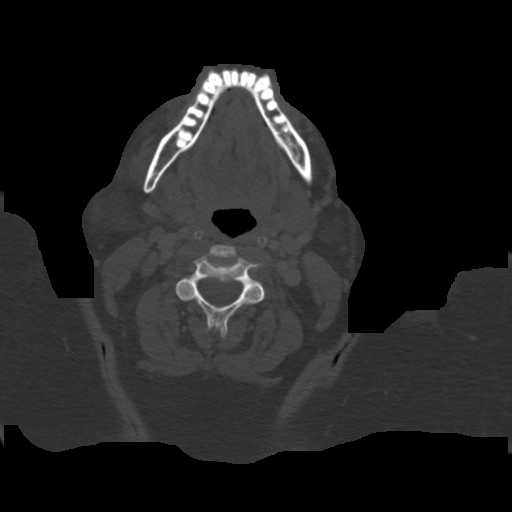
[im 91/110  soft-tissue]
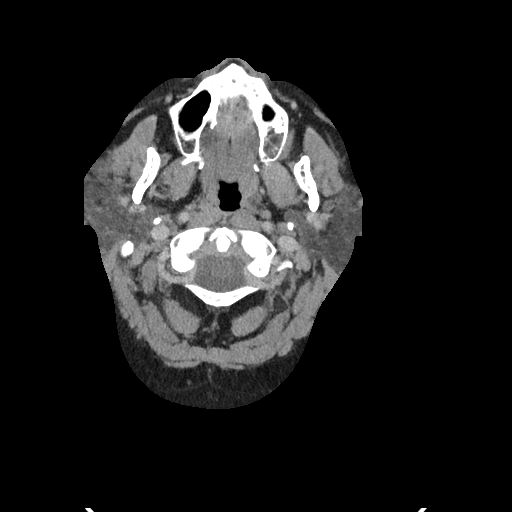
[im 91/110  bone]
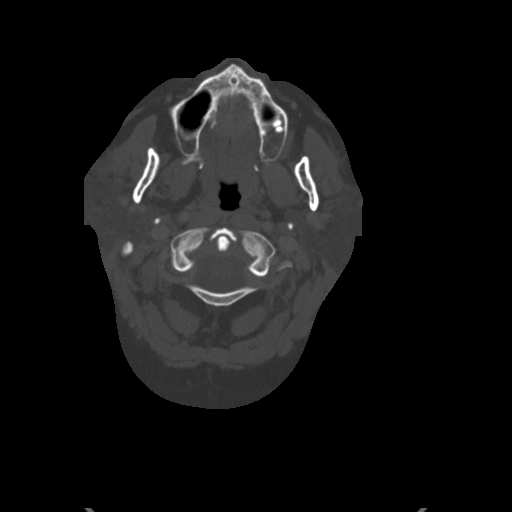

[Series 5: sag neck · sagittal · 0.42mm/px · 5 of 81 slices shown, 6 images]
[im 27/81  bone]
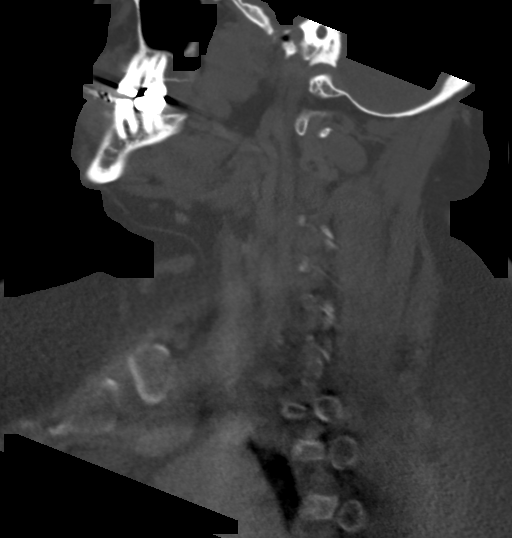
[im 34/81  bone]
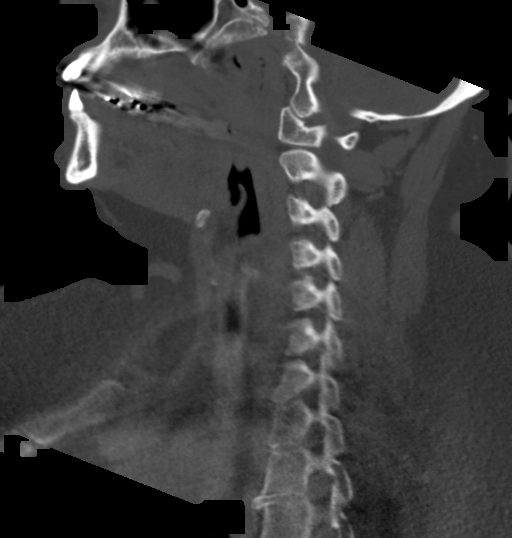
[im 41/81  soft-tissue]
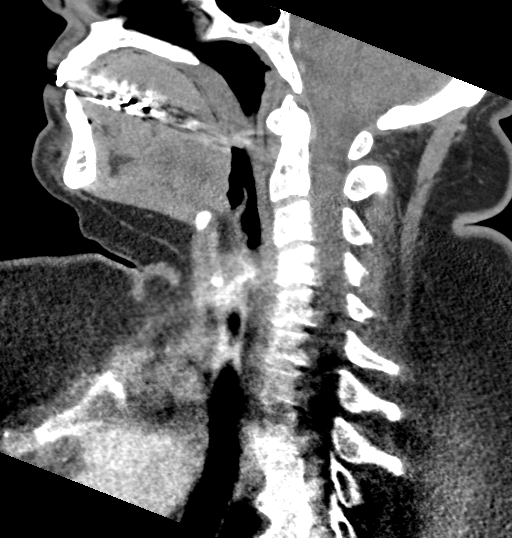
[im 41/81  bone]
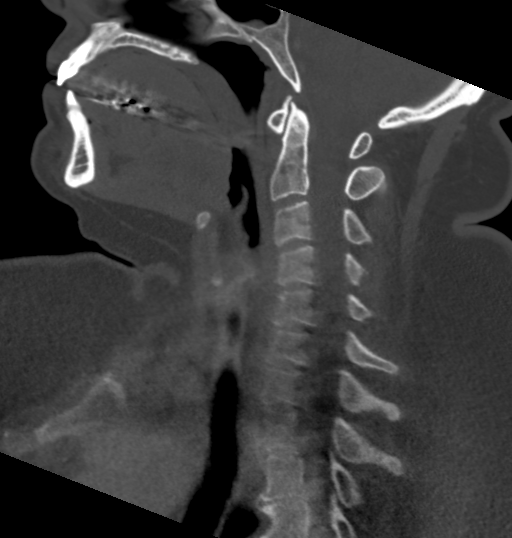
[im 47/81  bone]
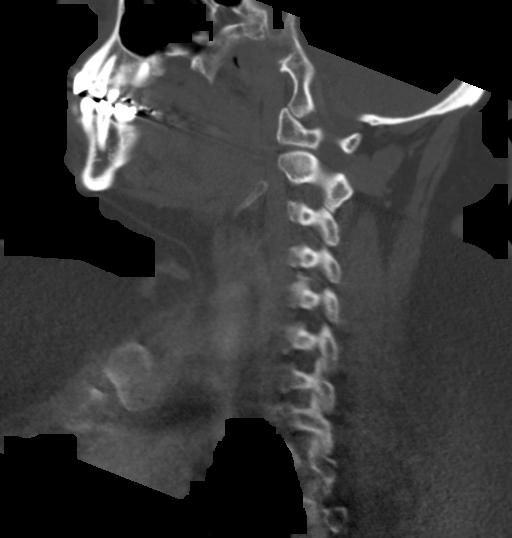
[im 54/81  bone]
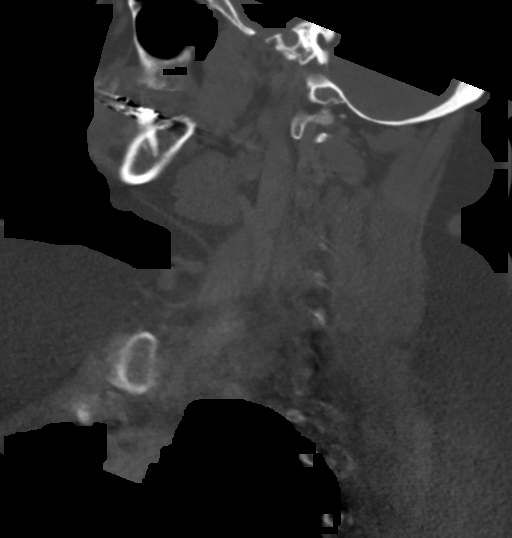

[Series 6: cor neck · coronal · 0.36mm/px · 3 of 101 slices shown]
[im 30/101  bone]
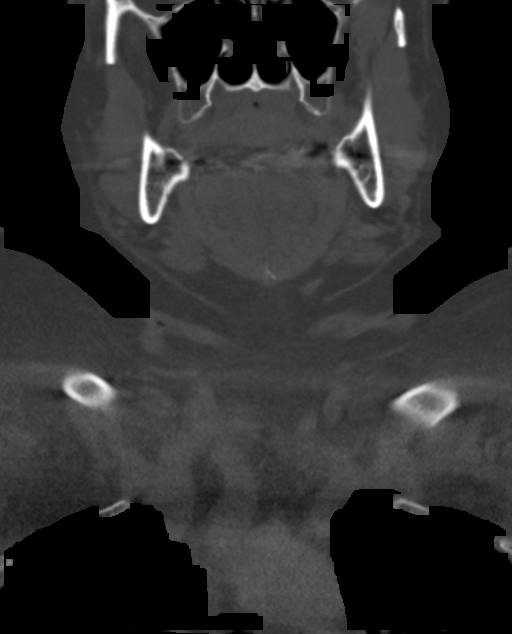
[im 44/101  bone]
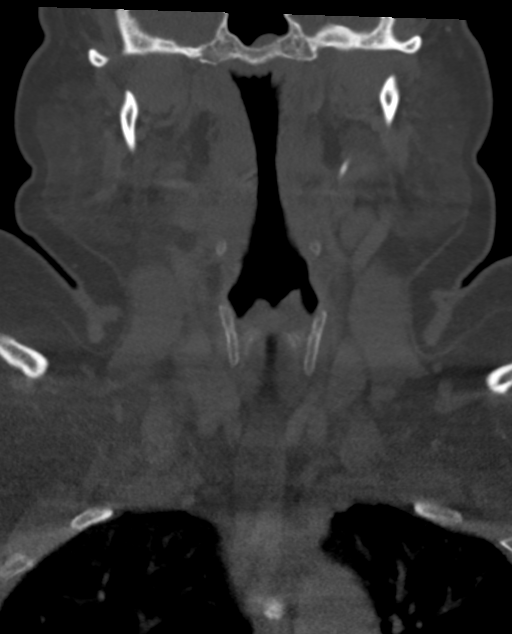
[im 58/101  bone]
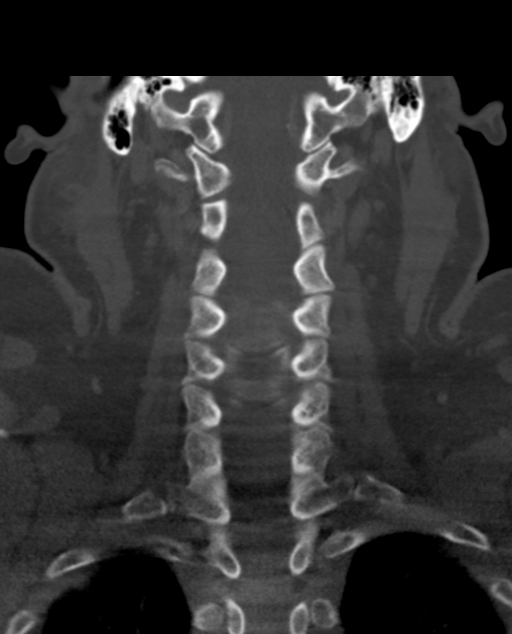

[Series 7: orthogonal ax · axial · 0.38mm/px · z∈[+259,+293]mm · 2 of 111 slices shown]
[im 19/111  bone]
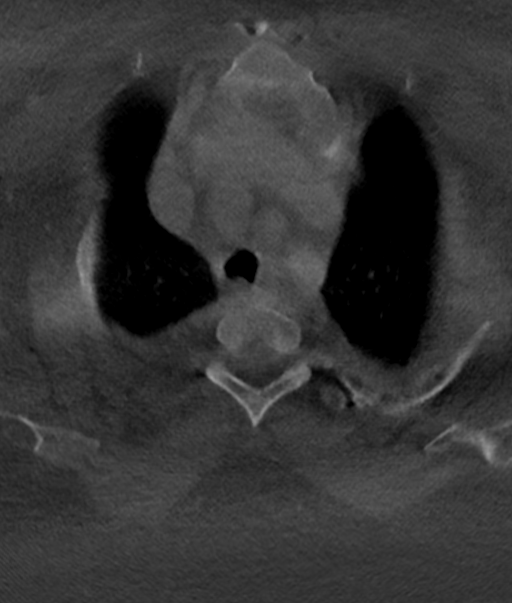
[im 37/111  bone]
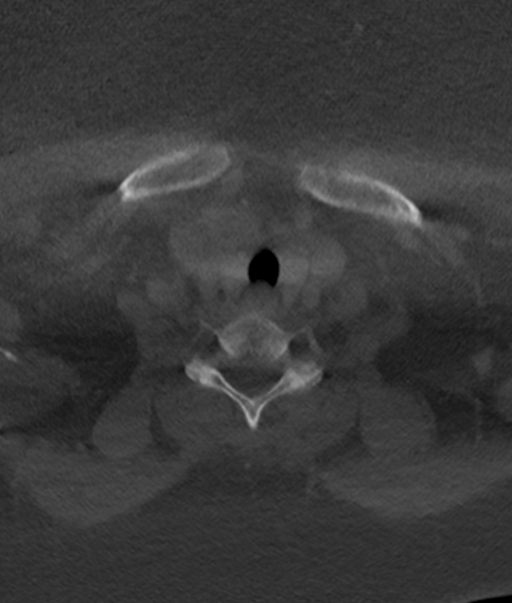

[15 of 33 positions shown; findings below may reference images not displayed]

FINDINGS: Pharynx and larynx: No mucosal or submucosal lesion.

Salivary glands: Nonspecific swelling and edema of the right parotid
gland. Unilateral parotitis without evidence of stone disease
usually represents viral infection. Left parotid gland is normal.
Submandibular glands are normal.

Thyroid: Normal

Lymph nodes: No enlarged or low-density lymph nodes on either side
of the neck. Normal bilateral nodes.

Vascular: No abnormal vascular finding.

Limited intracranial: Normal

Visualized orbits: Normal

Mastoids and visualized paranasal sinuses: Clear

Skeleton: No significant bone finding.

Upper chest: Normal

Other: None
IMPRESSION: Swelling and edematous change of the right parotid gland. Unilateral
parotitis without evidence of stone disease is usually secondary to
viral infection.

## 2020-01-07 MED ORDER — AMOXICILLIN-POT CLAVULANATE 875-125 MG PO TABS: 1.0000 | ORAL_TABLET | Freq: Two times a day (BID) | ORAL | 0 refills | Status: DC

## 2020-01-07 MED ORDER — AMOXICILLIN-POT CLAVULANATE 875-125 MG PO TABS
1.0000 | ORAL_TABLET | Freq: Once | ORAL | Status: AC
  Administered 2020-01-07: 1 via ORAL

## 2020-01-07 MED ORDER — IOHEXOL 300 MG/ML  SOLN
75.0000 mL | Freq: Once | INTRAMUSCULAR | Status: AC | PRN
  Administered 2020-01-07: 23:00:00 75 mL via INTRAVENOUS
  Filled 2020-01-07: qty 75

## 2020-01-07 NOTE — ED Triage Notes (Signed)
Patient to ED with complaint of possible abscess. States she felt a sensation under her right ear that caused her "to freak out." Looked in the mirror and noted that it was swollen. The swelling came and went then she felt like she couldn't swallow but that sensation has gone away. Still feels like she has swelling under her ear. There is some swelling noted under the ear that is nontender.

## 2020-01-07 NOTE — ED Provider Notes (Signed)
[facility] Emergency Department Provider Note  ____________________________________________  Time seen: Approximately 9:43 PM  I have reviewed the triage vital signs and the nursing notes.   HISTORY  Chief Complaint Abscess (right ear/tooth )    HPI Ruth Pennington is a 49 y.o. female who presents the emergency department concern for right-sided facial and submandibular swelling.  Patient states that she was at home, had taken her nighttime medications and had gotten up to get a snack.  Patient sat down, started to eat when she felt a weird pressure-like sensation around the angle of her mandible.  Patient states that she got up, looked at herself in the mirror and noticed significant edema in the preauricular extending into the submandibular region.  Patient denied any sore throat, difficulty breathing or swallowing.  No edema of the tongue or lips.  Patient states that she has never had anything like this.  She denied any frank pain with this and described it more as a pressure sensation.  Patient states that it has improved somewhat since the initial edema.  She denies any URI symptoms, ear pain, neck pain.  No fevers or chills.  No recent new foods or medications.         Past Medical History:  Diagnosis Date  . Endometrial cancer ([place])   . Endometrial cancer ([place])   . Hypertension   . Hypokalemia   . Obesity, morbid ([place]) 01/09/2015    Patient Active Problem List   Diagnosis Date Noted  . History of endometrial cancer 05/26/2019  . Bilateral nephrolithiasis 03/16/2018  . Essential hypertension 01/09/2015  . GERD without esophagitis 01/09/2015  . Obesity, morbid ([place]) 01/09/2015    Past Surgical History:  Procedure Laterality Date  . ABDOMINAL HYSTERECTOMY     cervix removed   . APPENDECTOMY    . CHOLECYSTECTOMY    . LAPAROSCOPIC APPENDECTOMY N/A 03/06/2018   Procedure: APPENDECTOMY LAPAROSCOPIC;  Surgeon: Jeanie Cooks, MD;  Location: [facility]  ORS;  Service: General;  Laterality: N/A;  . OOPHORECTOMY Bilateral 04/01/2018    Prior to Admission medications   Medication Sig Start Date End Date Taking? Authorizing Provider  acetaminophen (TYLENOL) 500 MG tablet Take by mouth. 04/04/18   [provider]  amoxicillin-clavulanate (AUGMENTIN) 875-125 MG tablet Take 1 tablet by mouth 2 (two) times daily. 01/07/20   Cuthriell, Charline Bills, PA-C  ibuprofen (ADVIL,MOTRIN) 200 MG tablet Take by mouth.    [provider]  lisinopril (ZESTRIL) 10 MG tablet TAKE 1 TABLET(10 MG) BY MOUTH DAILY 09/17/19   Lesleigh Noe, MD    Allergies Patient has no known allergies.  Family History  Problem Relation Age of Onset  . Hypertension Mother   . Aortic stenosis Mother   . Other Mother        Trigeminal neuralgia  . Glaucoma Father   . Hypertension Father   . Diabetes Father   . Prostate cancer Father   . Prostate cancer Brother     Social History Social History   Tobacco Use  . Smoking status: Never Smoker  . Smokeless tobacco: Never Used  Substance Use Topics  . Alcohol use: Yes    Comment: occassionally - 3 times a year  . Drug use: Never     Review of Systems  Constitutional: No fever/chills Eyes: No visual changes. No discharge ENT: Patient with right-sided preauricular/submandibular edema.  Now resolving. Cardiovascular: no chest pain. Respiratory: no cough. No SOB. Gastrointestinal: No abdominal pain.  No nausea,  no vomiting.  No diarrhea.  No constipation. Musculoskeletal: Negative for musculoskeletal pain. Skin: Negative for rash, abrasions, lacerations, ecchymosis. Neurological: Negative for headaches, focal weakness or numbness. 10-point ROS otherwise negative.  ____________________________________________   PHYSICAL EXAM:  VITAL SIGNS: ED Triage Vitals  Enc Vitals Group     BP 01/07/20 2135 (!) 151/87     Pulse Rate 01/07/20 2135 (!) 117     Resp 01/07/20 2135 18     Temp 01/07/20 2135 98  F (36.7 C)     Temp Source 01/07/20 2135 Oral     SpO2 --      Weight 01/07/20 2138 (!) 330 lb (149.7 kg)     Height 01/07/20 2138 5\' 6"  (1.676 m)     Head Circumference --      Peak Flow --      Pain Score 01/07/20 2135 3     Pain Loc --      Pain Edu? --      Excl. in [place]? --      Constitutional: Alert and oriented. Well appearing and in no acute distress. Eyes: Conjunctivae are normal. PERRL. EOMI. Head: Atraumatic.  Visualization of the face reveals no gross erythema or edema.  Palpation reveals slight palpable edema noted originating in the preauricular area extending along the angle of mandible into the submandibular region.  This is nontender to palpation.  No fluctuance or induration.  No overlying skin changes.  Palpation over the remaining facial and head structures reveals no palpable findings and no tenderness. ENT:      Ears:       Nose: No congestion/rhinnorhea.      Mouth/Throat: Mucous membranes are moist.  No oropharyngeal erythema or edema identified.  Uvula is midline.  No evidence of peritonsillar or retropharyngeal abscess. Neck: No stridor.  Neck is supple full range of motion.  No anterior neck erythema or edema. Hematological/Lymphatic/Immunilogical: A few scattered, nontender anterior cervical lymphadenopathy. Cardiovascular: Normal rate, regular rhythm. Normal S1 and S2.  Good peripheral circulation. Respiratory: Normal respiratory effort without tachypnea or retractions. Lungs CTAB. Good air entry to the bases with no decreased or absent breath sounds. Musculoskeletal: Full range of motion to all extremities. No gross deformities appreciated. Neurologic:  Normal speech and language. No gross focal neurologic deficits are appreciated.  Skin:  Skin is warm, dry and intact. No rash noted. Psychiatric: Mood and affect are normal. Speech and behavior are normal. Patient exhibits appropriate insight and judgement.   ____________________________________________    LABS (all labs ordered are listed, but only abnormal results are displayed)  Labs Reviewed  COMPREHENSIVE METABOLIC PANEL - Abnormal; Notable for the following components:      Result Value   Glucose, Bld 117 (*)    All other components within normal limits  CBC WITH DIFFERENTIAL/PLATELET   ____________________________________________  EKG   ____________________________________________  RADIOLOGY I personally viewed and evaluated these images as part of my medical decision making, as well as reviewing the written report by the radiologist.  CT Soft Tissue Neck W Contrast  Result Date: 01/07/2020 CLINICAL DATA:  Right facial swelling EXAM: CT NECK WITH CONTRAST TECHNIQUE: Multidetector CT imaging of the neck was performed using the standard protocol following the bolus administration of intravenous contrast. CONTRAST:  64mL OMNIPAQUE IOHEXOL 300 MG/ML  SOLN COMPARISON:  None. FINDINGS: Pharynx and larynx: No mucosal or submucosal lesion. Salivary glands: Nonspecific swelling and edema of the right parotid gland. Unilateral parotitis without evidence of stone disease usually represents  viral infection. Left parotid gland is normal. Submandibular glands are normal. Thyroid: Normal Lymph nodes: No enlarged or low-density lymph nodes on either side of the neck. Normal bilateral nodes. Vascular: No abnormal vascular finding. Limited intracranial: Normal Visualized orbits: Normal Mastoids and visualized paranasal sinuses: Clear Skeleton: No significant bone finding. Upper chest: Normal Other: None IMPRESSION: Swelling and edematous change of the right parotid gland. Unilateral parotitis without evidence of stone disease is usually secondary to viral infection. Electronically Signed   By: Nelson Chimes M.D.   On: 01/07/2020 23:22    ____________________________________________    PROCEDURES  Procedure(s) performed:    Procedures    Medications  iohexol (OMNIPAQUE) 300 MG/ML solution  75 mL (75 mLs Intravenous Contrast Given 01/07/20 2301)  amoxicillin-clavulanate (AUGMENTIN) 875-125 MG per tablet 1 tablet (1 tablet Oral Given 01/07/20 2336)     ____________________________________________   INITIAL IMPRESSION / ASSESSMENT AND PLAN / ED COURSE  Pertinent labs & imaging results that were available during my care of the patient were reviewed by me and considered in my medical decision making (see chart for details).  Review of the [place] CSRS was performed in accordance of the [place] prior to dispensing any controlled drugs.           Patient's diagnosis is consistent with acute parotitis.  Patient presented to emergency department complaining of sudden onset swelling, full sensation to the right preauricular and submandibular region.  On initial exam, edema had improved.  There was some mild firmness in the preauricular extending along the angle of the mandible into the submandibular region.  Given this finding on physical exam, symptoms differential included cellulitis, dental abscess, parotitis, sialoadenitis.  Labs, imaging were undertaken.  Labs returned with reassuring results.  Findings on CT were consistent with parotitis.  While this definitely could be attributed to viral illness, with no other viral symptoms, I suspect bacterial etiology.  Patient will be covered with Augmentin, instructed to use our candy.  Follow-up with ENT if symptoms do not improve.  Return precautions are discussed with the patient..  Patient is given ED precautions to return to the ED for any worsening or new symptoms.     ____________________________________________  FINAL CLINICAL IMPRESSION(S) / ED DIAGNOSES  Final diagnoses:  Parotitis, acute      NEW MEDICATIONS STARTED DURING THIS VISIT:  ED Discharge Orders         Ordered    amoxicillin-clavulanate (AUGMENTIN) 875-125 MG tablet  2 times daily     01/07/20 2333              This chart was dictated using voice  recognition software/Dragon. Despite best efforts to proofread, errors can occur which can change the meaning. Any change was purely unintentional.    Darletta Moll, PA-C 01/07/20 2336    Lilia Pro., MD 01/08/20 [phone]

## 2020-01-07 NOTE — Discharge Instructions (Signed)
In addition to your antibiotics, eat the most sour candy that you can find.  You may see intermittent swelling over the next day or 2.  This should start to improve in the next 48 hours  If you develop fevers or chills, difficulty breathing or swallowing, increased swelling, redness to your neck please return to the emergency department.  Follow-up with primary care or ENT if needed.

## 2020-01-07 NOTE — ED Notes (Signed)
Pt to ct scan.

## 2020-01-25 ENCOUNTER — Encounter: Payer: Self-pay | Admitting: Family Medicine

## 2020-01-31 NOTE — Telephone Encounter (Signed)
Chart updated with this information. FYI to PCP

## 2020-05-10 ENCOUNTER — Encounter: Payer: Self-pay | Admitting: Family Medicine

## 2020-05-24 ENCOUNTER — Other Ambulatory Visit: Payer: Self-pay

## 2020-05-24 ENCOUNTER — Ambulatory Visit (INDEPENDENT_AMBULATORY_CARE_PROVIDER_SITE_OTHER): Payer: BC Managed Care – PPO | Admitting: Family Medicine

## 2020-05-24 ENCOUNTER — Encounter: Payer: Self-pay | Admitting: Family Medicine

## 2020-05-24 VITALS — HR 87 | Temp 98.2°F | Wt 339.2 lb

## 2020-05-24 DIAGNOSIS — M898X1 Other specified disorders of bone, shoulder: Secondary | ICD-10-CM | POA: Diagnosis not present

## 2020-05-24 DIAGNOSIS — Z8542 Personal history of malignant neoplasm of other parts of uterus: Secondary | ICD-10-CM | POA: Diagnosis not present

## 2020-05-24 DIAGNOSIS — I10 Essential (primary) hypertension: Secondary | ICD-10-CM | POA: Diagnosis not present

## 2020-05-24 MED ORDER — CYCLOBENZAPRINE HCL 5 MG PO TABS: 5.0000 mg | ORAL_TABLET | Freq: Three times a day (TID) | ORAL | 1 refills | Status: DC | PRN

## 2020-05-24 NOTE — Patient Instructions (Addendum)
#  Referral I have placed a referral to a specialist for you. You should receive a phone call from the specialty office. Make sure your voicemail is not full and that if you are able to answer your phone to unknown or new numbers.   It may take up to 2 weeks to hear about the referral. If you do not hear anything in 2 weeks, please call our office and ask to speak with the referral coordinator.    Muscle relaxant - may cause you to be sleepy

## 2020-05-24 NOTE — Assessment & Plan Note (Signed)
Difficulty obtaining BP today. Advised home monitoring and patient will MyChart her home check. Also due for annual to be scheduled in a few weeks.

## 2020-05-24 NOTE — Progress Notes (Signed)
Subjective:     Ruth Pennington is a 49 y.o. female presenting for Back Pain (upper back, under right shoulder blade)     Back Pain This is a new problem. The current episode started 1 to 4 weeks ago. The pain is present in the thoracic spine. The pain does not radiate. The pain is moderate. The pain is worse during the day. The symptoms are aggravated by bending and twisting (movement - going downstairs). Pertinent negatives include no abdominal pain, bladder incontinence, bowel incontinence, chest pain, headaches, numbness, tingling or weakness. Risk factors include obesity. She has tried analgesics and NSAIDs (icy/hot) for the symptoms. The treatment provided mild relief.     Was treated for a UTI and the back pain seemed to improve over the course of treatment Initially started around 6/10 and improved Then last week started hurting again Not sure what will cause worsening pain  #HTN - does not check at home - feeling well - taking medication  - no cp, sob, headache    Review of Systems  Cardiovascular: Negative for chest pain.  Gastrointestinal: Negative for abdominal pain and bowel incontinence.  Genitourinary: Negative for bladder incontinence.  Musculoskeletal: Positive for back pain.  Neurological: Negative for tingling, weakness, numbness and headaches.     Social History   Tobacco Use  Smoking Status Never Smoker  Smokeless Tobacco Never Used        Objective:    BP Readings from Last 3 Encounters:  01/07/20 114/63  05/26/19 110/72  11/24/18 110/64   Wt Readings from Last 3 Encounters:  05/24/20 (!) 339 lb 4 oz (153.9 kg)  01/07/20 (!) 330 lb (149.7 kg)  05/26/19 (!) 314 lb 12 oz (142.8 kg)    Pulse 87   Temp 98.2 F (36.8 C) (Temporal)   Wt (!) 339 lb 4 oz (153.9 kg)   SpO2 97%   BMI 54.76 kg/m    Physical Exam Constitutional:      General: She is not in acute distress.    Appearance: She is well-developed. She is obese. She is not  diaphoretic.  HENT:     Right Ear: External ear normal.     Left Ear: External ear normal.  Eyes:     Conjunctiva/sclera: Conjunctivae normal.  Cardiovascular:     Rate and Rhythm: Normal rate.  Pulmonary:     Effort: Pulmonary effort is normal.  Musculoskeletal:     Cervical back: Neck supple.     Comments: Back:  Inspection: no deformity Palpation: no spinous or paraspinous TTP. Does have some TTP along the teres minor/major area.  ROM: normal but with some discomfort with abduction/flexion   Skin:    General: Skin is warm and dry.     Capillary Refill: Capillary refill takes less than 2 seconds.  Neurological:     Mental Status: She is alert. Mental status is at baseline.  Psychiatric:        Mood and Affect: Mood normal.        Behavior: Behavior normal.           Assessment & Plan:   Problem List Items Addressed This Visit      Cardiovascular and Mediastinum   Essential hypertension    Difficulty obtaining BP today. Advised home monitoring and patient will MyChart her home check. Also due for annual to be scheduled in a few weeks.         Other   History of endometrial cancer  Missed f/u last year. Encouraged reaching out to GYN to see if f/u is necessary. Suspect she will need to be seen as missed 5 year f/u and now 6 years out.       Shoulder blade pain - Primary    Suspect muscle strain/tightness likely related to some posture issues. Advised Flexeril prn for severe spasm pain and PT referral for evaluation and treatment      Relevant Medications   cyclobenzaprine (FLEXERIL) 5 MG tablet   Other Relevant Orders   Ambulatory referral to Physical Therapy       Return in about 6 weeks (around 07/05/2020).  Lesleigh Noe, MD  This visit occurred during the SARS-CoV-2 public health emergency.  Safety protocols were in place, including screening questions prior to the visit, additional usage of staff PPE, and extensive cleaning of exam room while  observing appropriate contact time as indicated for disinfecting solutions.

## 2020-05-24 NOTE — Assessment & Plan Note (Signed)
Missed f/u last year. Encouraged reaching out to GYN to see if f/u is necessary. Suspect she will need to be seen as missed 5 year f/u and now 6 years out.

## 2020-05-24 NOTE — Assessment & Plan Note (Signed)
Suspect muscle strain/tightness likely related to some posture issues. Advised Flexeril prn for severe spasm pain and PT referral for evaluation and treatment

## 2020-06-09 ENCOUNTER — Encounter: Payer: Self-pay | Admitting: Family Medicine

## 2020-08-19 ENCOUNTER — Ambulatory Visit (INDEPENDENT_AMBULATORY_CARE_PROVIDER_SITE_OTHER): Payer: BC Managed Care – PPO

## 2020-08-19 ENCOUNTER — Other Ambulatory Visit: Payer: Self-pay

## 2020-08-19 DIAGNOSIS — Z23 Encounter for immunization: Secondary | ICD-10-CM | POA: Diagnosis not present

## 2020-10-12 ENCOUNTER — Other Ambulatory Visit: Payer: Self-pay | Admitting: Family Medicine

## 2020-10-12 DIAGNOSIS — I10 Essential (primary) hypertension: Secondary | ICD-10-CM

## 2020-11-10 ENCOUNTER — Other Ambulatory Visit: Payer: Self-pay | Admitting: Family Medicine

## 2020-11-10 DIAGNOSIS — I10 Essential (primary) hypertension: Secondary | ICD-10-CM

## 2020-12-05 ENCOUNTER — Encounter: Payer: Self-pay | Admitting: Family Medicine

## 2020-12-05 DIAGNOSIS — I10 Essential (primary) hypertension: Secondary | ICD-10-CM

## 2020-12-05 MED ORDER — LISINOPRIL 10 MG PO TABS: 10.0000 mg | ORAL_TABLET | Freq: Every day | ORAL | 0 refills | Status: DC

## 2021-03-03 ENCOUNTER — Other Ambulatory Visit: Payer: Self-pay | Admitting: Family Medicine

## 2021-03-03 DIAGNOSIS — I10 Essential (primary) hypertension: Secondary | ICD-10-CM

## 2021-03-22 ENCOUNTER — Encounter: Payer: Self-pay | Admitting: Family Medicine

## 2021-03-22 ENCOUNTER — Ambulatory Visit (INDEPENDENT_AMBULATORY_CARE_PROVIDER_SITE_OTHER): Payer: BC Managed Care – PPO | Admitting: Family Medicine

## 2021-03-22 ENCOUNTER — Other Ambulatory Visit: Payer: Self-pay

## 2021-03-22 VITALS — BP 124/86 | HR 95 | Temp 98.3°F | Ht 65.5 in | Wt 340.5 lb

## 2021-03-22 DIAGNOSIS — Z0000 Encounter for general adult medical examination without abnormal findings: Secondary | ICD-10-CM | POA: Diagnosis not present

## 2021-03-22 DIAGNOSIS — R7309 Other abnormal glucose: Secondary | ICD-10-CM | POA: Diagnosis not present

## 2021-03-22 DIAGNOSIS — Z1211 Encounter for screening for malignant neoplasm of colon: Secondary | ICD-10-CM

## 2021-03-22 DIAGNOSIS — I10 Essential (primary) hypertension: Secondary | ICD-10-CM | POA: Diagnosis not present

## 2021-03-22 LAB — LIPID PANEL
Cholesterol: 201 mg/dL — ABNORMAL HIGH (ref 0–200)
HDL: 50.4 mg/dL (ref >39.00)
LDL Cholesterol: 124 mg/dL — ABNORMAL HIGH (ref 0–99)
NonHDL: 150.47
Total CHOL/HDL Ratio: 4
Triglycerides: 131 mg/dL (ref 0.0–149.0)
VLDL: 26.2 mg/dL (ref 0.0–40.0)

## 2021-03-22 LAB — COMPREHENSIVE METABOLIC PANEL
ALT: 45 U/L — ABNORMAL HIGH (ref 0–35)
AST: 37 U/L (ref 0–37)
Albumin: 4.2 g/dL (ref 3.5–5.2)
Alkaline Phosphatase: 86 U/L (ref 39–117)
BUN: 11 mg/dL (ref 6–23)
CO2: 29 mEq/L (ref 19–32)
Calcium: 9.5 mg/dL (ref 8.4–10.5)
Chloride: 102 mEq/L (ref 96–112)
Creatinine, Ser: 0.68 mg/dL (ref 0.40–1.20)
GFR: 101.42 mL/min (ref >60.00)
Glucose, Bld: 97 mg/dL (ref 70–99)
Potassium: 4.3 mEq/L (ref 3.5–5.1)
Sodium: 138 mEq/L (ref 135–145)
Total Bilirubin: 0.9 mg/dL (ref 0.2–1.2)
Total Protein: 7.2 g/dL (ref 6.0–8.3)

## 2021-03-22 LAB — TSH: TSH: 1 u[IU]/mL (ref 0.35–4.50)

## 2021-03-22 LAB — HEMOGLOBIN A1C: Hgb A1c MFr Bld: 5.2 % (ref 4.6–6.5)

## 2021-03-22 MED ORDER — LISINOPRIL 10 MG PO TABS: ORAL_TABLET | ORAL | 3 refills | Status: DC

## 2021-03-22 NOTE — Assessment & Plan Note (Signed)
She will work to add exercise and healthy diet. F/u 3 months - briefly discussed option of ozempic for weight loss if no improvement.

## 2021-03-22 NOTE — Patient Instructions (Addendum)
Home work 1) Call your oncology/gyn - for follow-up 2) schedule mammogram 3) Try to walk 5 minutes every day and increase as able 4) work on healthy diet   #Referral I have placed a referral to a specialist for you. You should receive a phone call from the specialty office. Make sure your voicemail is not full and that if you are able to answer your phone to unknown or new numbers.   It may take up to 2 weeks to hear about the referral. If you do not hear anything in 2 weeks, please call our office and ask to speak with the referral coordinator.    Please call the location of your choice from the menu below to schedule your Mammogram and/or Bone Density appointment.     [place]  [address] [place] at [facility]   Phone:  [phone]   [place], [place] [address]                                            Services: 3D Mammogram and [place] location:   [facility] [phone]-  -Counseling/therapy (14 years and up- Medicaid insurance only)   -Psychiatry services most insurances accepted-treat and evaluate/diagnose patients and prescribe medications.  -Medication Management 51 years old to 18 years.  -Diagnoses treated: Depression/anxiety, ADHD, Substance Abuse, Bipolar Disorder, Psychotherapy, Pain Management, Spiritual Care Services.  [facility] (58 years of age and older) [phone]- Psychology services/therapy only.  [facility] [phone]- All ages-Just Therapy services   [facility] [phone]- [place] Programs-counseling only.   [place] [phone]- All ages-Psychology and Psychiatrist services (evaluate, treat, diagnose, prescribe medication) Treat all the diagnoses that would fall under Behavioral issues.   For new patients, on Monday, Wednesday  and Friday from 8 am to 3 pm patient would just walk in and fill out paperwork and they would get patient enrolled in the services they need based on their answers.   [facility] [phone]- All ages. Monday through Friday-9am to 4 pm walk in times for patients to come in and be evaluated. Medication Management only at this time. No therapy available.   [place] location:   [facility] Address: [address] [place] [address] Phone: [phone] Counseling and psychiatry services.   [facility] and [place] locations)- [phone]- all ages, only therapy/counseling services/psychological evaluations. Services: aptitude testing, academic achievement testing, learning Disability evaluation, ADHD evaluations, psycho-educational evaluations, readiness for kindergarten Evaluations.   [place] location only has therapy services right now  [facility] [phone] services only. Works off [employer] in [place].

## 2021-03-22 NOTE — Progress Notes (Signed)
Annual Exam   Chief Complaint:  Chief Complaint  Patient presents with  . Annual Exam    History of Present Illness:  Ms. Ruth Pennington is a 51 y.o. No obstetric history on file. who LMP was No LMP recorded. Patient has had a hysterectomy., presents today for her annual examination.    #depressed mood - lots of life events and healthy issues - was in therapy last spring - stopped due to cost  Nutrition She does get adequate calcium and Vitamin D in her diet. Diet: not great currently, weight gain Exercise: getting easily fatigue  Safety The patient wears seatbelts: yes.     The patient feels safe at home and in their relationships: yes.   Menstrual:  Symptoms of menopause: moodiness, no energy   GYN She is single partner, contraception - status post hysterectomy.    Cervical Cancer Screening (21-65):   No longer has a cervix  Breast Cancer Screening (Age 26-74):  There is no FH of breast cancer. There is no FH of ovarian cancer. BRCA screening Not Indicated.  Last Mammogram: not done The patient does want a mammogram this year.    Colon Cancer Screening:  Age 60-75 yo - benefits outweigh the risk. Adults 63-85 yo who have never been screened benefit.  Benefits: 134000 people in 2016 will be diagnosed and 49,000 will die - early detection helps [place]: Complications 2/2 to colonoscopy High Risk (Colonoscopy): genetic disorder (Lynch syndrome or familial adenomatous polyposis), personal hx of IBD, previous adenomatous polyp, or previous colorectal cancer, FamHx start 10 years before the age at diagnosis, increased in males and black race  Options:  FIT - looks for hemoglobin (blood in the stool) - specific and fairly sensitive - must be done annually Cologuard - looks for DNA and blood - more sensitive - therefore can have more false positives, every 3 years Colonoscopy - every 10 years if normal - sedation, bowl prep, must have someone drive you  Shared decision  making and the patient had decided to do colonoscopy.   Social History   Tobacco Use  Smoking Status Never Smoker  Smokeless Tobacco Never Used    Lung Cancer Screening (Ages 41-74): not applicable   Weight Wt Readings from Last 3 Encounters:  03/22/21 (!) 340 lb 8 oz (154.4 kg)  05/24/20 (!) 339 lb 4 oz (153.9 kg)  01/07/20 (!) 330 lb (149.7 kg)   Patient has very high BMI  BMI Readings from Last 1 Encounters:  03/22/21 55.80 kg/m     Chronic disease screening Blood pressure monitoring:  BP Readings from Last 3 Encounters:  03/22/21 124/86  01/07/20 114/63  05/26/19 110/72    Lipid Monitoring: Indication for screening: age >58, obesity, diabetes, family hx, CV risk factors.  Lipid screening: Yes  Lab Results  Component Value Date   CHOL 208 (H) 11/24/2018   HDL 51.20 11/24/2018   LDLCALC 125 (H) 11/24/2018   TRIG 159.0 (H) 11/24/2018   CHOLHDL 4 11/24/2018     Diabetes Screening: age >56, overweight, family hx, PCOS, hx of gestational diabetes, at risk ethnicity Diabetes Screening screening: Yes  No results found for: HGBA1C   Past Medical History:  Diagnosis Date  . Endometrial cancer ([place])   . Endometrial cancer ([place])   . Hypertension   . Hypokalemia   . Obesity, morbid ([place]) 01/09/2015    Past Surgical History:  Procedure Laterality Date  . ABDOMINAL HYSTERECTOMY     cervix removed   .  APPENDECTOMY    . CHOLECYSTECTOMY    . LAPAROSCOPIC APPENDECTOMY N/A 03/06/2018   Procedure: APPENDECTOMY LAPAROSCOPIC;  Surgeon: Jeanie Cooks, MD;  Location: [facility] ORS;  Service: General;  Laterality: N/A;  . OOPHORECTOMY Bilateral 04/01/2018    Prior to Admission medications   Medication Sig Start Date End Date Taking? Authorizing Provider  acetaminophen (TYLENOL) 500 MG tablet Take by mouth every 6 (six) hours as needed.  04/04/18  Yes [provider]  ibuprofen (ADVIL,MOTRIN) 200 MG tablet Take by mouth every 6 (six) hours as needed.    Yes  [provider]  lisinopril (ZESTRIL) 10 MG tablet TAKE 1 TABLET(10 MG) BY MOUTH DAILY 03/05/21  Yes Lesleigh Noe, MD    No Known Allergies  Gynecologic History: No LMP recorded. Patient has had a hysterectomy.  Obstetric History: No obstetric history on file.  Social History   Socioeconomic History  . Marital status: Married    Spouse name: Karlton Lemon  . Number of children: 5  . Years of education: college  . Highest education level: Not on file  Occupational History  . Not on file  Tobacco Use  . Smoking status: Never Smoker  . Smokeless tobacco: Never Used  Substance and Sexual Activity  . Alcohol use: Yes    Comment: occassionally - 3 times a year  . Drug use: Never  . Sexual activity: Yes    Birth control/protection: Surgical  Other Topics Concern  . Not on file  Social History Narrative   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   Has 5 children - 3 at home, 2 in college   Enjoys: planning cruises, playing candy crush   Exercise: hoping to get back in walking regularly   Diet: avoids carbs   Social Determinants of Health   Financial Resource Strain: Not on file  Food Insecurity: Not on file  Transportation Needs: Not on file  Physical Activity: Not on file  Stress: Not on file  Social Connections: Not on file  Intimate Partner Violence: Not on file    Family History  Problem Relation Age of Onset  . Hypertension Mother   . Aortic stenosis Mother   . Other Mother        Trigeminal neuralgia  . Glaucoma Father   . Hypertension Father   . Diabetes Father   . Prostate cancer Father   . Prostate cancer Brother     Review of Systems  Constitutional: Negative for chills and fever.  HENT: Negative for congestion and sore throat.   Eyes: Negative for blurred vision and double vision.  Respiratory: Negative for shortness of breath.   Cardiovascular: Negative for chest pain.  Gastrointestinal: Negative for heartburn,  nausea and vomiting.  Genitourinary: Negative.   Musculoskeletal: Negative.  Negative for myalgias.  Skin: Negative for rash.  Neurological: Negative for dizziness and headaches.  Endo/Heme/Allergies: Does not bruise/bleed easily.  Psychiatric/Behavioral: Negative for depression. The patient is not nervous/anxious.      Physical Exam BP 124/86   Pulse 95   Temp 98.3 F (36.8 C) (Temporal)   Ht 5' 5.5" (1.664 m)   Wt (!) 340 lb 8 oz (154.4 kg)   SpO2 97%   BMI 55.80 kg/m    BP Readings from Last 3 Encounters:  03/22/21 124/86  01/07/20 114/63  05/26/19 110/72      Physical Exam Constitutional:      General: She is not in acute distress.    Appearance: She  is well-developed. She is obese. She is not diaphoretic.  HENT:     Head: Normocephalic and atraumatic.     Right Ear: External ear normal.     Left Ear: External ear normal.     Nose: Nose normal.  Eyes:     General: No scleral icterus.    Conjunctiva/sclera: Conjunctivae normal.  Cardiovascular:     Rate and Rhythm: Normal rate and regular rhythm.     Heart sounds: No murmur heard.   Pulmonary:     Effort: Pulmonary effort is normal. No respiratory distress.     Breath sounds: Normal breath sounds. No wheezing.  Abdominal:     General: Bowel sounds are normal. There is no distension.     Palpations: Abdomen is soft. There is no mass.     Tenderness: There is no abdominal tenderness. There is no guarding or rebound.  Musculoskeletal:        General: Normal range of motion.     Cervical back: Neck supple.  Lymphadenopathy:     Cervical: No cervical adenopathy.  Skin:    General: Skin is warm and dry.     Capillary Refill: Capillary refill takes less than 2 seconds.  Neurological:     Mental Status: She is alert and oriented to person, place, and time.     Deep Tendon Reflexes: Reflexes normal.  Psychiatric:        Behavior: Behavior normal.     Results:  PHQ-9:  [place] Office Visit from  03/22/2021 in [place] at [place]  PHQ-9 Total Score 6        Assessment: 50 y.o. No obstetric history on file. female here for routine annual physical examination.  Plan: Problem List Items Addressed This Visit      Cardiovascular and Mediastinum   Essential hypertension   Relevant Medications   lisinopril (ZESTRIL) 10 MG tablet   Other Relevant Orders   Comprehensive metabolic panel     Other   Obesity, morbid ([place])    She will work to add exercise and healthy diet. F/u 3 months - briefly discussed option of ozempic for weight loss if no improvement.       Relevant Orders   Lipid panel   TSH    Other Visit Diagnoses    Annual physical exam    -  Primary   Elevated glucose       Relevant Orders   Hemoglobin A1c   Colon cancer screening       Relevant Orders   Ambulatory referral to gastroenterology for colonoscopy      Screening: -- Blood pressure screen normal -- cholesterol screening: will obtain -- Weight screening: obese: discussed management options, including lifestyle, dietary, and exercise. -- Diabetes Screening: will obtain -- Nutrition: Encouraged healthy diet  The 10-year ASCVD risk score Mikey Bussing DC Jr., et al., 2013) is: 1.8%   Values used to calculate the score:     Age: 3 years     Sex: Female     Is Non-Hispanic African American: No     Diabetic: No     Tobacco smoker: No     Systolic Blood Pressure: 409 mmHg     Is BP treated: Yes     HDL Cholesterol: 51.2 mg/dL     Total Cholesterol: 208 mg/dL  -- Statin therapy for Age 10-75 with CVD risk >7.5%  Psych -- Depression screening (PHQ-9):  [place] Office Visit from 03/22/2021 in [place] at [place]  PHQ-9 Total Score 6     Slightly elevated - encouraged therapy Return 3 months, may consider medication   Safety -- tobacco screening: not using -- alcohol screening:  low-risk usage. -- no evidence of domestic violence or intimate partner  violence.   Cancer Screening -- pap smear not collected per ASCCP guidelines -- family history of breast cancer screening: done. not at high risk. -- Mammogram - number provided to call and schedule -- Colon cancer (age 56+)-- referral to GI  Immunizations Immunization History  Administered Date(s) Administered  . Influenza Whole 08/09/2015  . Influenza,inj,Quad PF,6+ Mos 12/30/2016, 10/29/2018, 08/26/2019, 08/19/2020  . Influenza-Unspecified 09/08/2014  . Moderna Sars-Covid-2 Vaccination 01/29/2020, 02/26/2020, 10/14/2020  . PPD Test 07/10/2017  . Tdap 11/24/2018    -- flu vaccine up to date -- TDAP q10 years up to date -- Shingles (age >75) will get at f/u appointment -- Covid-19 Vaccine up to date   Encouraged healthy diet and exercise. Encouraged regular vision and dental care.    Lesleigh Noe, MD

## 2021-04-10 ENCOUNTER — Telehealth (INDEPENDENT_AMBULATORY_CARE_PROVIDER_SITE_OTHER): Payer: Self-pay | Admitting: Gastroenterology

## 2021-04-10 DIAGNOSIS — Z1211 Encounter for screening for malignant neoplasm of colon: Secondary | ICD-10-CM

## 2021-04-10 MED ORDER — PEG 3350-KCL-NA BICARB-NACL 420 G PO SOLR: 4000.0000 mL | Freq: Once | ORAL | 0 refills | Status: AC

## 2021-04-10 NOTE — Progress Notes (Signed)
Gastroenterology Pre-Procedure Review  Request Date: 06/01/2021 Requesting Physician: Dr. Bonna Gains   PATIENT REVIEW QUESTIONS: The patient responded to the following health history questions as indicated:    1. Are you having any GI issues? no 2. Do you have a personal history of Polyps? No  3. Do you have a family history of Colon Cancer or Polyps? yes (Father had colon polyps ) 4. Diabetes Mellitus? no 5. Joint replacements in the past 12 months?no 6. Major health problems in the past 3 months?no 7. Any artificial heart valves, MVP, or defibrillator?no    MEDICATIONS & ALLERGIES:    Patient reports the following regarding taking any anticoagulation/antiplatelet therapy:   Plavix, Coumadin, Eliquis, Xarelto, Lovenox, Pradaxa, Brilinta, or Effient? no Aspirin? no  Patient confirms/reports the following medications:  Current Outpatient Medications  Medication Sig Dispense Refill  . acetaminophen (TYLENOL) 500 MG tablet Take by mouth every 6 (six) hours as needed.     Marland Kitchen ibuprofen (ADVIL,MOTRIN) 200 MG tablet Take by mouth every 6 (six) hours as needed.     Marland Kitchen lisinopril (ZESTRIL) 10 MG tablet TAKE 1 TABLET(10 MG) BY MOUTH DAILY 90 tablet 3   No current facility-administered medications for this visit.    Patient confirms/reports the following allergies:  No Known Allergies  No orders of the defined types were placed in this encounter.   AUTHORIZATION INFORMATION Primary Insurance: 1D#: Group #:  Secondary Insurance: 1D#: Group #:  SCHEDULE INFORMATION: Date: 06/01/2021 Time: Location: [place]

## 2021-04-26 ENCOUNTER — Encounter: Payer: Self-pay | Admitting: Family Medicine

## 2021-05-13 ENCOUNTER — Encounter: Payer: Self-pay | Admitting: Family Medicine

## 2021-05-14 ENCOUNTER — Encounter: Payer: Self-pay | Admitting: Family Medicine

## 2021-05-14 ENCOUNTER — Telehealth: Payer: BC Managed Care – PPO | Admitting: Family Medicine

## 2021-05-14 VITALS — BP 140/89 | HR 83 | Temp 98.3°F | Wt 340.0 lb

## 2021-05-14 DIAGNOSIS — U071 COVID-19: Secondary | ICD-10-CM | POA: Diagnosis not present

## 2021-05-14 DIAGNOSIS — I10 Essential (primary) hypertension: Secondary | ICD-10-CM | POA: Diagnosis not present

## 2021-05-14 MED ORDER — NIRMATRELVIR/RITONAVIR (PAXLOVID)TABLET: 3.0000 | ORAL_TABLET | Freq: Two times a day (BID) | ORAL | 0 refills | Status: AC

## 2021-05-14 MED ORDER — PROMETHAZINE-DM 6.25-15 MG/5ML PO SYRP: 5.0000 mL | ORAL_SOLUTION | Freq: Four times a day (QID) | ORAL | 0 refills | Status: DC | PRN

## 2021-05-14 MED ORDER — BENZONATATE 100 MG PO CAPS: 100.0000 mg | ORAL_CAPSULE | Freq: Three times a day (TID) | ORAL | 0 refills | Status: DC | PRN

## 2021-05-14 NOTE — Patient Instructions (Signed)
Isolation  - Mask ok on 6/23 - no mask and complete on 6/28  Cough medications if needed

## 2021-05-14 NOTE — Progress Notes (Signed)
    I connected with Ruth Pennington on 05/14/21 at 11:40 AM EDT by video and verified that I am speaking with the correct person using two identifiers.   I discussed the limitations, risks, security and privacy concerns of performing an evaluation and management service by video and the availability of in person appointments. I also discussed with the patient that there may be a patient responsible charge related to this service. The patient expressed understanding and agreed to proceed.  Patient location: Home Provider Location: [place] Participants: Lesleigh Noe and SHATASIA CUTSHAW   Subjective:     CELEST REITZ is a 50 y.o. female presenting for Covid Positive (Yesterday. Onset of symptoms 05/11/21), Cough, and Nasal Congestion     Cough   #Covid  - 05/11/2021 - positive covid test on 6/19 - congestion, runny nose, eyes watering - coughing - phlegm in throat - coughing a lot first thing in the morning - feeling tired - back and stomach pain from coughing  - treatment: tylenol and ibuprofen  - trying to cough things out - has been sleeping in the recliner    Review of Systems  Respiratory:  Positive for cough.     Social History   Tobacco Use  Smoking Status Never  Smokeless Tobacco Never        Objective:   BP Readings from Last 3 Encounters:  05/14/21 140/89  03/22/21 124/86  01/07/20 114/63   Wt Readings from Last 3 Encounters:  05/14/21 (!) 340 lb (154.2 kg)  03/22/21 (!) 340 lb 8 oz (154.4 kg)  05/24/20 (!) 339 lb 4 oz (153.9 kg)   BP 140/89   Pulse 83   Temp 98.3 F (36.8 C) (Temporal)   Wt (!) 340 lb (154.2 kg)   BMI 55.72 kg/m   Physical Exam Constitutional:      Appearance: Normal appearance. She is not ill-appearing.  HENT:     Head: Normocephalic and atraumatic.     Right Ear: External ear normal.     Left Ear: External ear normal.  Eyes:     Conjunctiva/sclera: Conjunctivae normal.  Pulmonary:     Effort:  Pulmonary effort is normal. No respiratory distress.  Neurological:     Mental Status: She is alert. Mental status is at baseline.  Psychiatric:        Mood and Affect: Mood normal.        Behavior: Behavior normal.        Thought Content: Thought content normal.        Judgment: Judgment normal.            Assessment & Plan:   Problem List Items Addressed This Visit       Cardiovascular and Mediastinum   Essential hypertension     Other   Obesity, morbid ([place])   Other Visit Diagnoses     COVID-19 virus infection    -  Primary   Relevant Medications   nirmatrelvir/ritonavir EUA (PAXLOVID) TABS   benzonatate (TESSALON PERLES) 100 MG capsule   promethazine-dextromethorphan (PROMETHAZINE-DM) 6.25-15 MG/5ML syrup      At Risk for severe covid due to obesity and HTN. Discussed paxlovid pt with normal kidney and liver function  ER precautions  Return if symptoms worsen or fail to improve.  Lesleigh Noe, MD

## 2021-05-14 NOTE — Telephone Encounter (Signed)
Pt called and left a message on triage line asking about her MyChart message.

## 2021-05-14 NOTE — Telephone Encounter (Signed)
Please have pt schedule virtual visit

## 2021-05-31 ENCOUNTER — Telehealth: Payer: Self-pay

## 2021-05-31 NOTE — Telephone Encounter (Signed)
Patient called Endo to cancel colonoscopy scheduled for tomorrow 06/01/21, they forwarded pt to me and pt does not wish to r/s at this time... pt reports that her husband is not able to get off from work and she does not have anyone else to bring her

## 2021-06-01 ENCOUNTER — Encounter: Admission: RE | Payer: Self-pay | Source: Home / Self Care

## 2021-06-01 ENCOUNTER — Ambulatory Visit
Admission: RE | Admit: 2021-06-01 | Payer: BC Managed Care – PPO | Source: Home / Self Care | Admitting: Gastroenterology

## 2021-06-01 ENCOUNTER — Other Ambulatory Visit: Payer: Self-pay | Admitting: Family Medicine

## 2021-06-01 DIAGNOSIS — I10 Essential (primary) hypertension: Secondary | ICD-10-CM

## 2021-06-01 SURGERY — COLONOSCOPY WITH PROPOFOL
Anesthesia: General

## 2021-06-10 ENCOUNTER — Other Ambulatory Visit: Payer: Self-pay

## 2021-06-10 ENCOUNTER — Emergency Department: Payer: BC Managed Care – PPO

## 2021-06-10 ENCOUNTER — Emergency Department
Admission: EM | Admit: 2021-06-10 | Discharge: 2021-06-10 | Disposition: A | Discharge status: Home / Self Care | Payer: BC Managed Care – PPO | Attending: Emergency Medicine | Admitting: Emergency Medicine

## 2021-06-10 DIAGNOSIS — Z79899 Other long term (current) drug therapy: Secondary | ICD-10-CM | POA: Insufficient documentation

## 2021-06-10 DIAGNOSIS — R079 Chest pain, unspecified: Secondary | ICD-10-CM | POA: Diagnosis not present

## 2021-06-10 DIAGNOSIS — R42 Dizziness and giddiness: Secondary | ICD-10-CM | POA: Insufficient documentation

## 2021-06-10 DIAGNOSIS — I10 Essential (primary) hypertension: Secondary | ICD-10-CM | POA: Diagnosis not present

## 2021-06-10 LAB — BASIC METABOLIC PANEL
Anion gap: 8 (ref 5–15)
BUN: 10 mg/dL (ref 6–20)
CO2: 28 mmol/L (ref 22–32)
Calcium: 9.1 mg/dL (ref 8.9–10.3)
Chloride: 102 mmol/L (ref 98–111)
Creatinine, Ser: 0.83 mg/dL (ref 0.44–1.00)
GFR, Estimated: >60 mL/min (ref >60)
Glucose, Bld: 167 mg/dL — ABNORMAL HIGH (ref 70–99)
Potassium: 4.2 mmol/L (ref 3.5–5.1)
Sodium: 138 mmol/L (ref 135–145)

## 2021-06-10 LAB — CBC
HCT: 44.6 % (ref 36.0–46.0)
Hemoglobin: 15.5 g/dL — ABNORMAL HIGH (ref 12.0–15.0)
MCH: 31.3 pg (ref 26.0–34.0)
MCHC: 34.8 g/dL (ref 30.0–36.0)
MCV: 90.1 fL (ref 80.0–100.0)
Platelets: 233 10*3/uL (ref 150–400)
RBC: 4.95 MIL/uL (ref 3.87–5.11)
RDW: 12.3 % (ref 11.5–15.5)
WBC: 7.4 10*3/uL (ref 4.0–10.5)
nRBC: 0 % (ref 0.0–0.2)

## 2021-06-10 LAB — TROPONIN I (HIGH SENSITIVITY)
Troponin I (High Sensitivity): 5 ng/L (ref <18)
Troponin I (High Sensitivity): 6 ng/L (ref <18)

## 2021-06-10 LAB — BRAIN NATRIURETIC PEPTIDE: B Natriuretic Peptide: 5.9 pg/mL (ref 0.0–100.0)

## 2021-06-10 IMAGING — MR MR HEAD WO/W CM
13 series · 48 of 48 positions shown · IV contrast (gadavist)
Comparison: None.

CLINICAL DATA: Dizziness

EXAM:
MRI HEAD WITHOUT AND WITH CONTRAST
MRA HEAD WITHOUT CONTRAST
MRA NECK WITHOUT AND WITH CONTRAST
TECHNIQUE: Multiplanar, multiecho pulse sequences of the brain and surrounding
structures were obtained without and with intravenous contrast.
Angiographic images of the Circle of Willis were obtained using MRA
technique without intravenous contrast. Angiographic images of the
neck were obtained using MRA technique without and with intravenous
contrast. Carotid stenosis measurements (when applicable) are
obtained utilizing NASCET criteria, using the distal internal
carotid diameter as the denominator.
CONTRAST:  10mL GADAVIST GADOBUTROL 1 MMOL/ML IV SOLN

[Series 5: ax dwi_tracew · axial · B · 3.0mm · 0.71mm/px · z∈[-92,+72]mm · 4 of 56 slices shown]
[im 1/56]
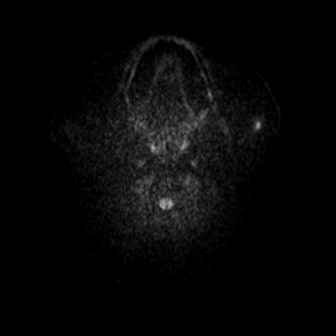
[im 19/56]
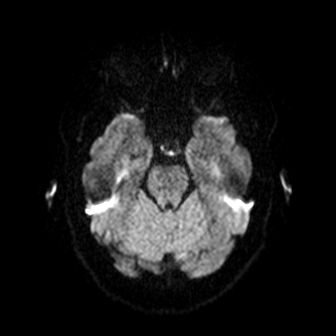
[im 37/56]
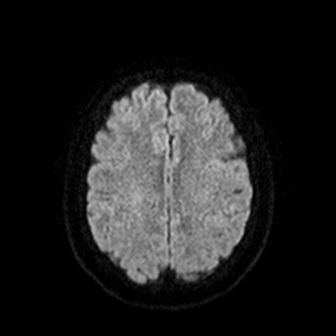
[im 56/56]
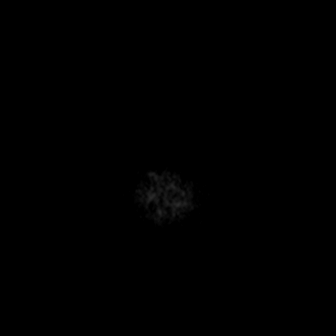

[Series 6: ax dwi_adc · axial · B · 3.0mm · 0.71mm/px · z∈[-92,+72]mm · 4 of 56 slices shown]
[im 1/56]
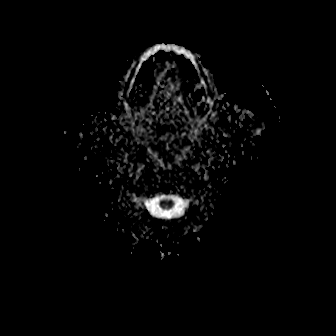
[im 19/56]
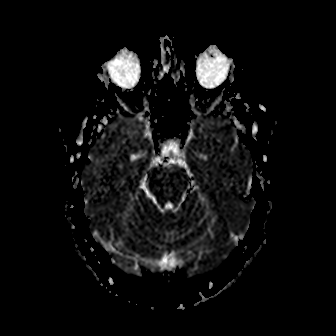
[im 37/56]
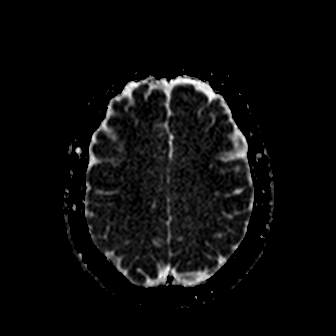
[im 56/56]
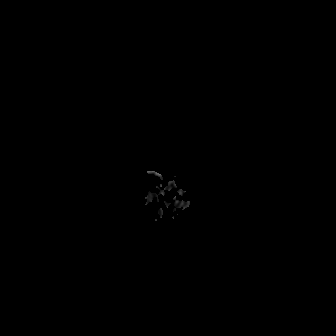

[Series 7: cor dwi_tracew · coronal · B · 5.0mm · 0.68mm/px · 2 of 40 slices shown]
[im 1/40]
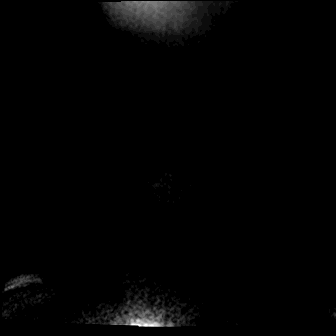
[im 40/40]
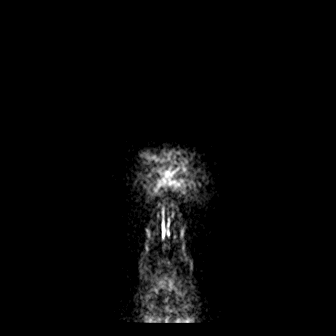

[Series 8: cor dwi_adc · coronal · B · 5.0mm · 0.68mm/px · 2 of 40 slices shown]
[im 1/40]
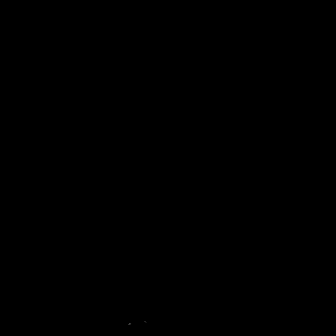
[im 40/40]
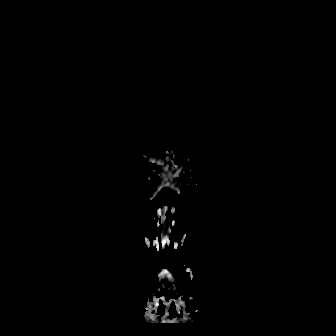

[Series 9: T1 · sagittal · B · 5.0mm · 0.47mm/px · 1 of 24 slices shown (1 of 2)]
[im 1/24]
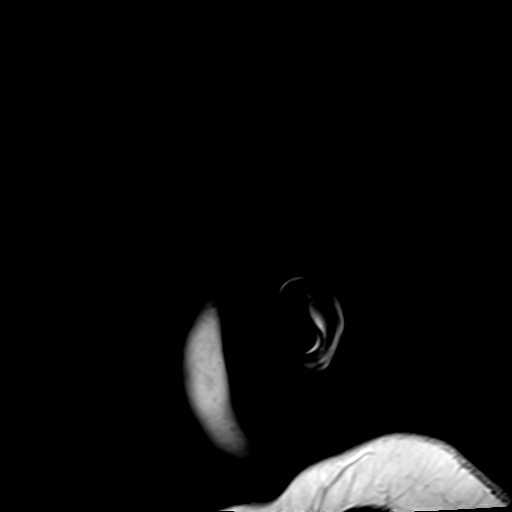

[Series 10: T2 · axial · B · 5.0mm · 0.86mm/px · z∈[-85,+71]mm · 2 of 27 slices shown (1 of 2)]
[im 1/27]
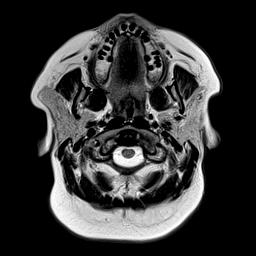
[im 27/27]
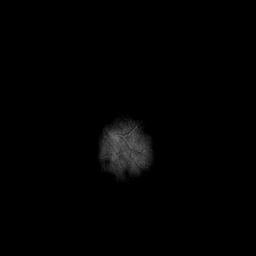

[Series 12: pha_images · axial · B · 3.0mm · 0.90mm/px · z∈[-83,+70]mm · 3 of 52 slices shown]
[im 1/52]
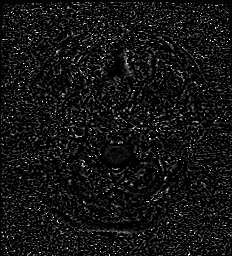
[im 26/52]
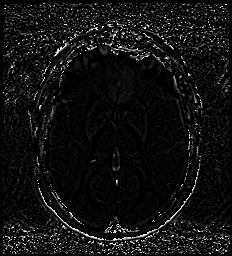
[im 52/52]
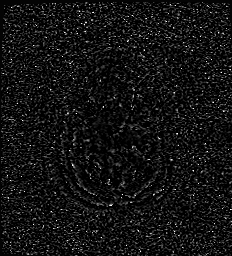

[Series 13: swi_images · axial · B · 3.0mm · 0.90mm/px · z∈[-83,+70]mm · 3 of 52 slices shown]
[im 1/52]
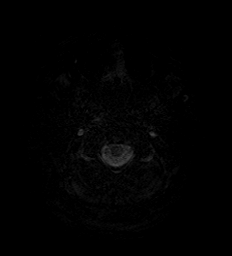
[im 26/52]
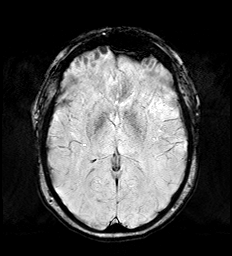
[im 52/52]
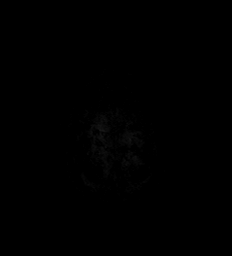

[Series 15: FLAIR · axial · B · 3.0mm · 0.69mm/px · z∈[-88,+74]mm · 3 of 55 slices shown]
[im 1/55]
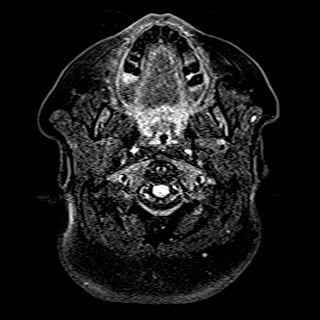
[im 28/55]
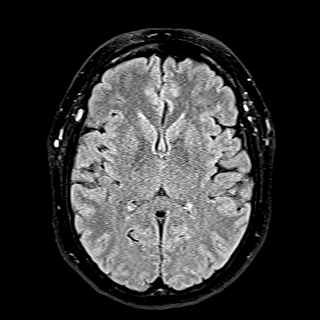
[im 55/55]
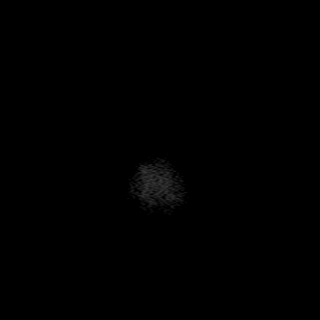

[Series 16: T1 · axial · B · 1.0mm · 0.98mm/px · z∈[-91,+83]mm · 10 of 173 slices shown (2 of 2)]
[im 1/173]
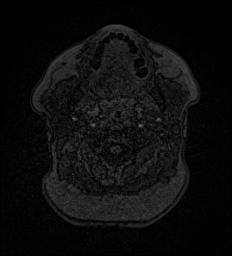
[im 20/173]
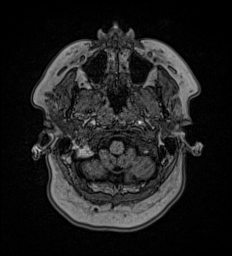
[im 39/173]
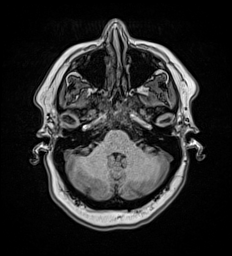
[im 58/173]
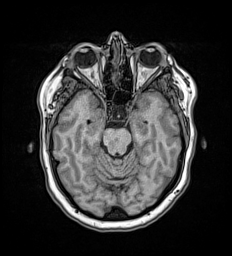
[im 77/173]
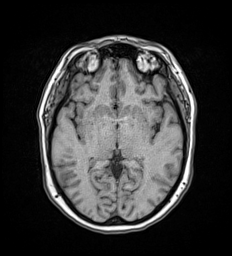
[im 96/173]
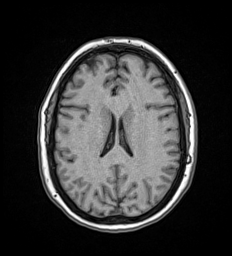
[im 115/173]
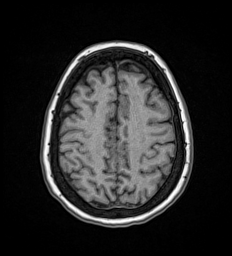
[im 134/173]
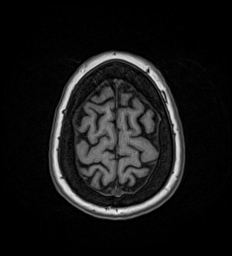
[im 153/173]
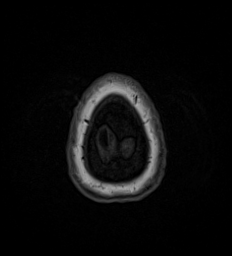
[im 173/173]
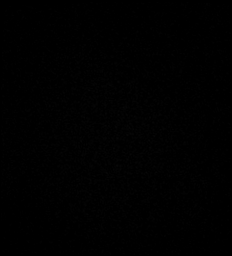

[Series 17: T2 · coronal · B · 5.0mm · 0.86mm/px · 2 of 30 slices shown (2 of 2)]
[im 1/30]
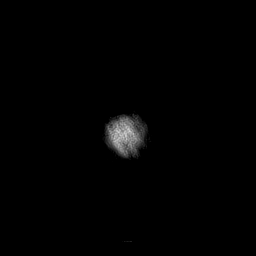
[im 30/30]
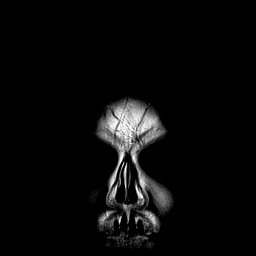

[Series 18: T1 post-contrast · axial · B · 1.0mm · 0.98mm/px · z∈[-91,+83]mm · 10 of 174 slices shown (1 of 2)]
[im 1/174]
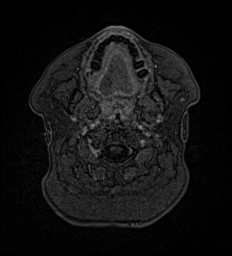
[im 20/174]
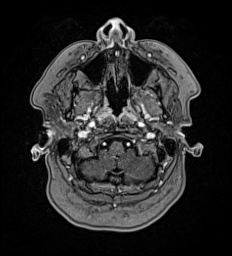
[im 39/174]
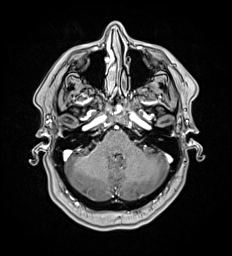
[im 58/174]
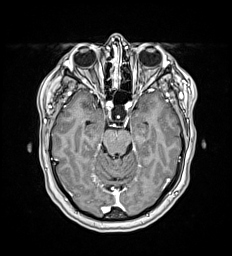
[im 77/174]
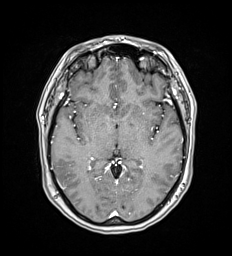
[im 97/174]
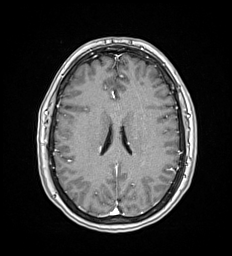
[im 116/174]
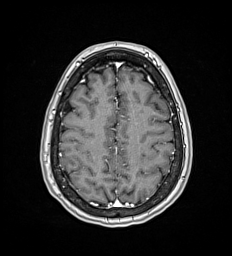
[im 135/174]
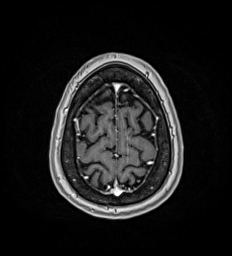
[im 154/174]
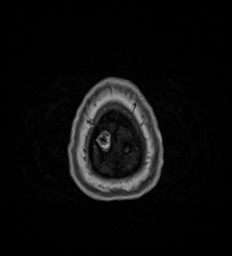
[im 174/174]
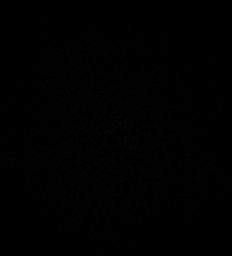

[Series 19: T1 post-contrast · coronal · B · 5.0mm · 0.43mm/px · 2 of 30 slices shown (2 of 2)]
[im 1/30]
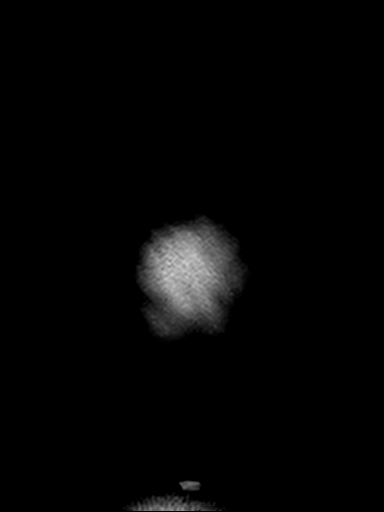
[im 30/30]
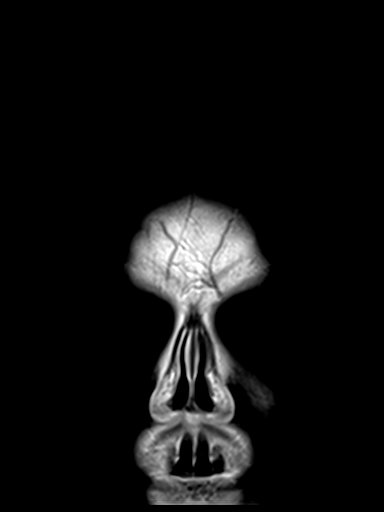

[48 of 48 positions shown; findings below may reference images not displayed]

FINDINGS: MRI HEAD FINDINGS

Brain: No acute infarction, hemorrhage, hydrocephalus, extra-axial
collection or mass lesion. Normal cerebral white matter. Normal
enhancement.

Vascular: Normal arterial flow voids.

Skull and upper cervical spine: Negative

Sinuses/Orbits: Negative

Other: None

MRA HEAD FINDINGS

Internal carotid artery widely patent bilaterally. Anterior and
middle cerebral arteries patent without large vessel occlusion or
significant stenosis.

Both vertebral arteries patent to the basilar. PICA patent
bilaterally. Basilar widely patent. AICA and superior cerebellar
arteries patent bilaterally. Posterior cerebral arteries patent
bilaterally. Fetal origin left posterior cerebral artery. Mild
irregular did left posterior cerebral artery could be artifact or
atherosclerotic disease.

Negative for cerebral aneurysm.

MRA NECK FINDINGS

Antegrade flow in the carotid and vertebral arteries bilaterally.

Carotid bifurcation is normal bilaterally. No carotid stenosis. Both
vertebral arteries are widely patent without stenosis.
IMPRESSION: 1. Negative MRI head with contrast
2. Mild irregularity left PCA which may be a mild atherosclerotic
disease versus artifact. Otherwise negative MRA head
3. Negative MRA neck

## 2021-06-10 IMAGING — MR MR MRA HEAD W/O CM
1 series · 19 of 48 positions shown · IV contrast (gadavist)
Comparison: None.

CLINICAL DATA: Dizziness

EXAM:
MRI HEAD WITHOUT AND WITH CONTRAST
MRA HEAD WITHOUT CONTRAST
MRA NECK WITHOUT AND WITH CONTRAST
TECHNIQUE: Multiplanar, multiecho pulse sequences of the brain and surrounding
structures were obtained without and with intravenous contrast.
Angiographic images of the Circle of Willis were obtained using MRA
technique without intravenous contrast. Angiographic images of the
neck were obtained using MRA technique without and with intravenous
contrast. Carotid stenosis measurements (when applicable) are
obtained utilizing NASCET criteria, using the distal internal
carotid diameter as the denominator.
CONTRAST:  10mL GADAVIST GADOBUTROL 1 MMOL/ML IV SOLN

[Series 1: TOF · axial · B · 0.5mm · 0.41mm/px · z∈[-88,+10]mm · 19 of 205 slices shown]
[im 1/205]
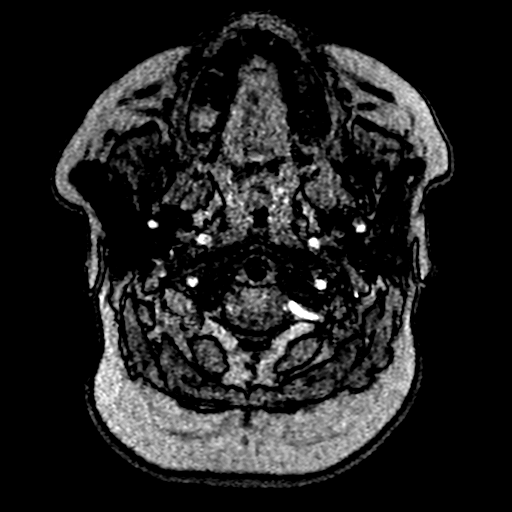
[im 5/205]
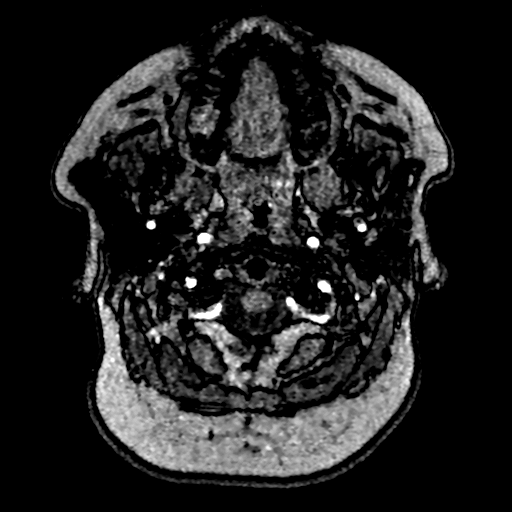
[im 9/205]
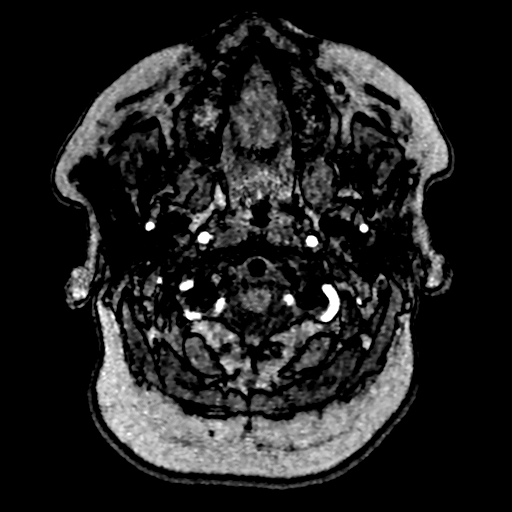
[im 14/205]
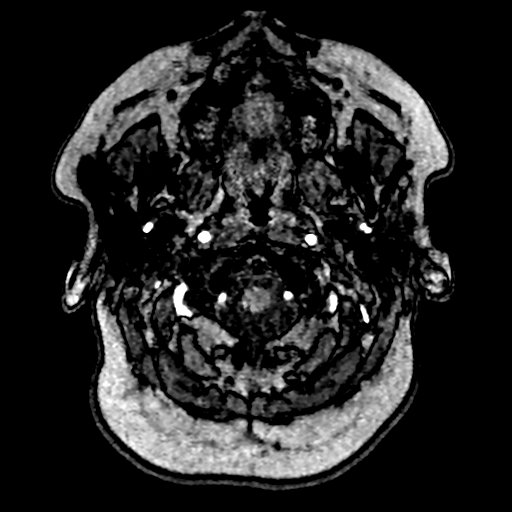
[im 18/205]
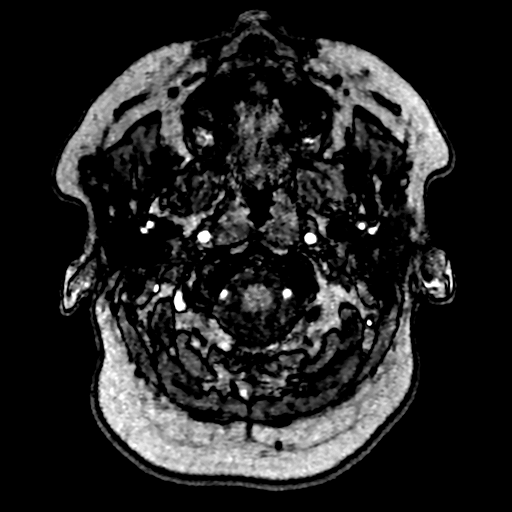
[im 22/205]
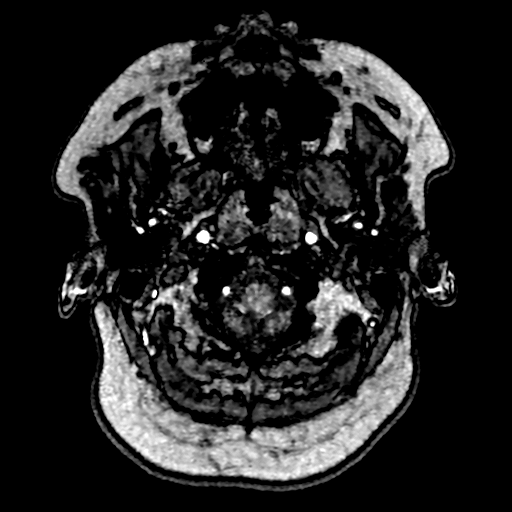
[im 27/205]
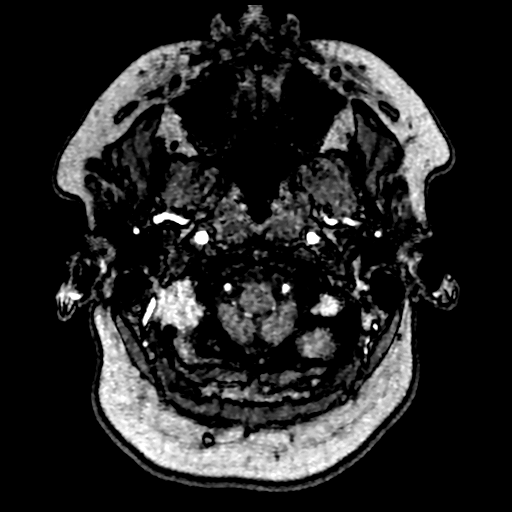
[im 31/205]
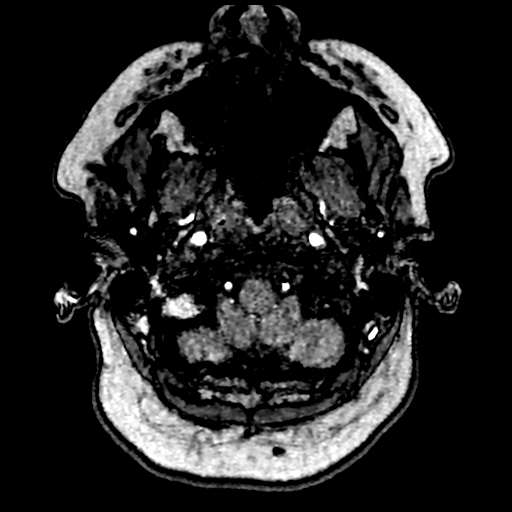
[im 35/205]
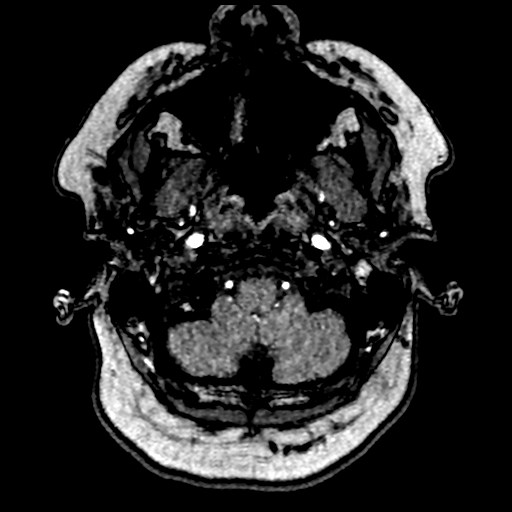
[im 40/205]
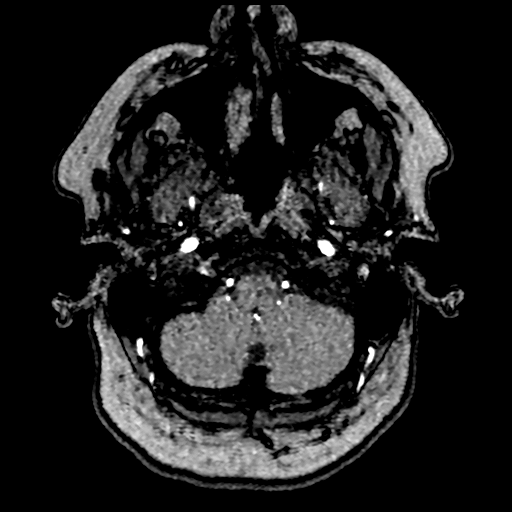
[im 44/205]
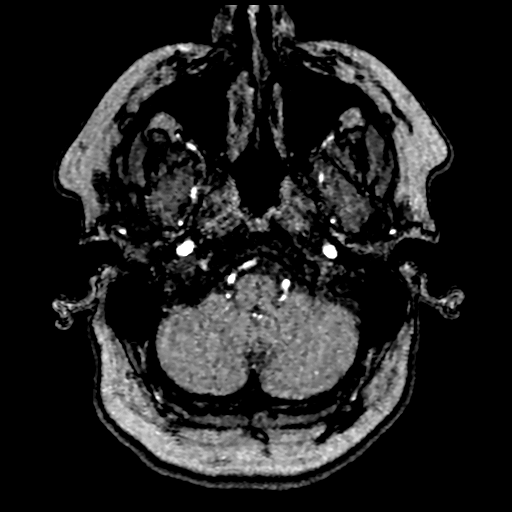
[im 66/205]
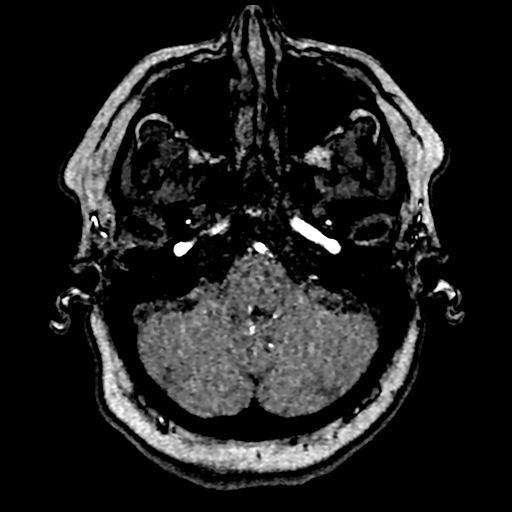
[im 92/205]
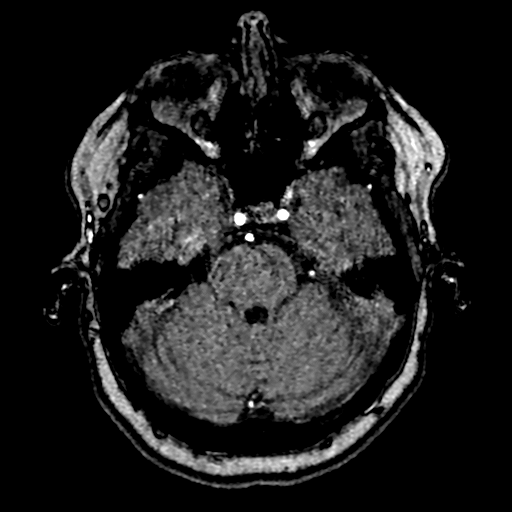
[im 105/205]
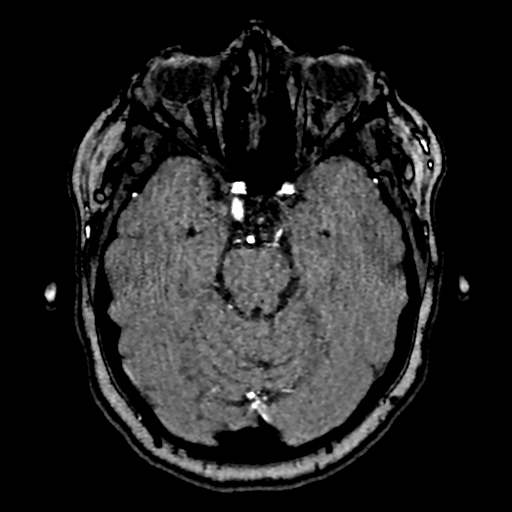
[im 118/205]
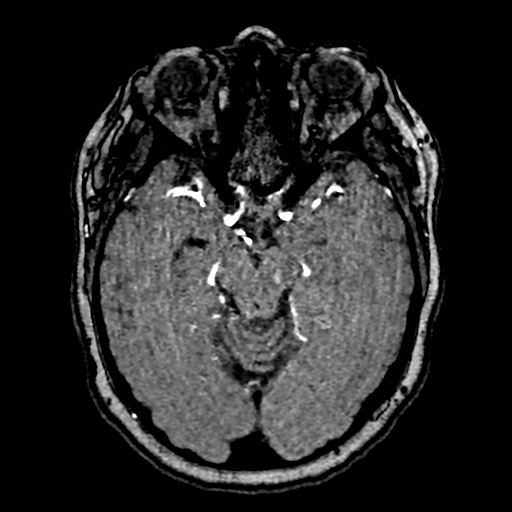
[im 144/205]
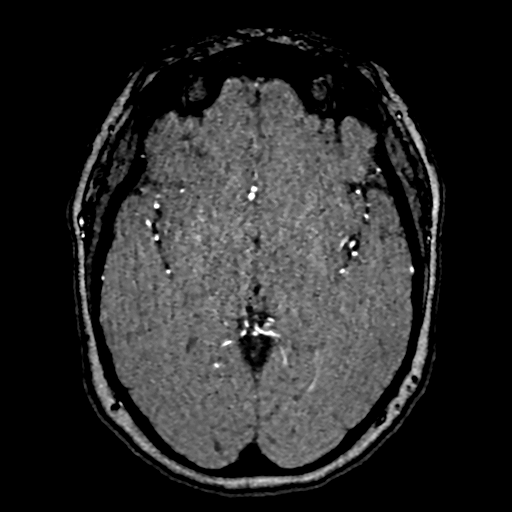
[im 170/205]
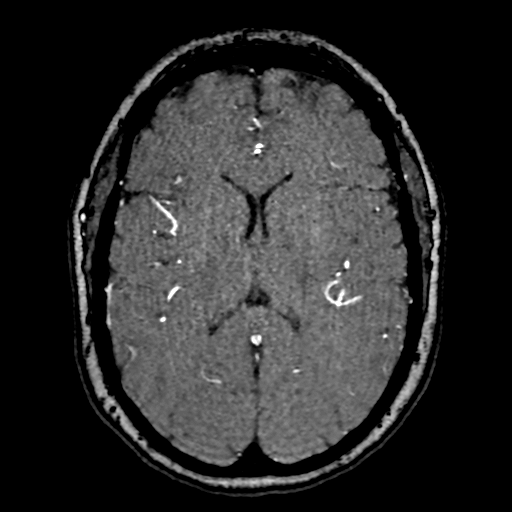
[im 174/205]
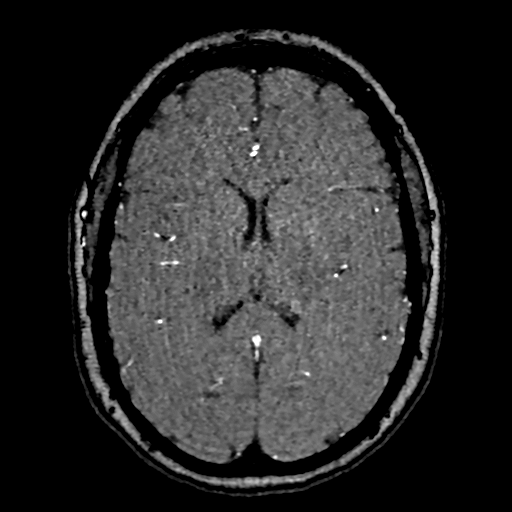
[im 196/205]
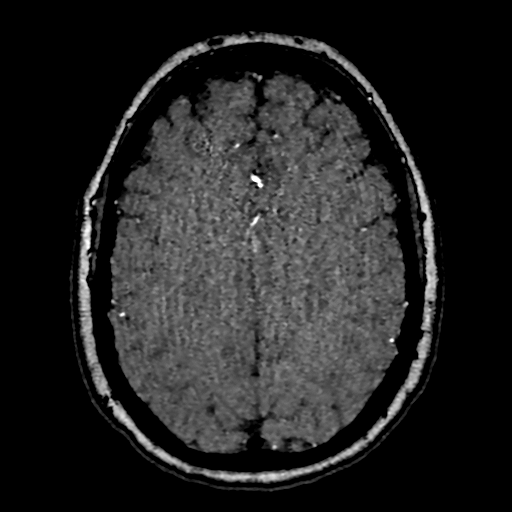

[19 of 48 positions shown; findings below may reference images not displayed]

FINDINGS: MRI HEAD FINDINGS

Brain: No acute infarction, hemorrhage, hydrocephalus, extra-axial
collection or mass lesion. Normal cerebral white matter. Normal
enhancement.

Vascular: Normal arterial flow voids.

Skull and upper cervical spine: Negative

Sinuses/Orbits: Negative

Other: None

MRA HEAD FINDINGS

Internal carotid artery widely patent bilaterally. Anterior and
middle cerebral arteries patent without large vessel occlusion or
significant stenosis.

Both vertebral arteries patent to the basilar. PICA patent
bilaterally. Basilar widely patent. AICA and superior cerebellar
arteries patent bilaterally. Posterior cerebral arteries patent
bilaterally. Fetal origin left posterior cerebral artery. Mild
irregular did left posterior cerebral artery could be artifact or
atherosclerotic disease.

Negative for cerebral aneurysm.

MRA NECK FINDINGS

Antegrade flow in the carotid and vertebral arteries bilaterally.

Carotid bifurcation is normal bilaterally. No carotid stenosis. Both
vertebral arteries are widely patent without stenosis.
IMPRESSION: 1. Negative MRI head with contrast
2. Mild irregularity left PCA which may be a mild atherosclerotic
disease versus artifact. Otherwise negative MRA head
3. Negative MRA neck

## 2021-06-10 IMAGING — CR DG CHEST 2V
2 series · 2 of 2 positions shown · non-contrast
Comparison: None.

CLINICAL DATA: Chest pain, dizziness

EXAM:
CHEST - 2 VIEW

[chest pa]
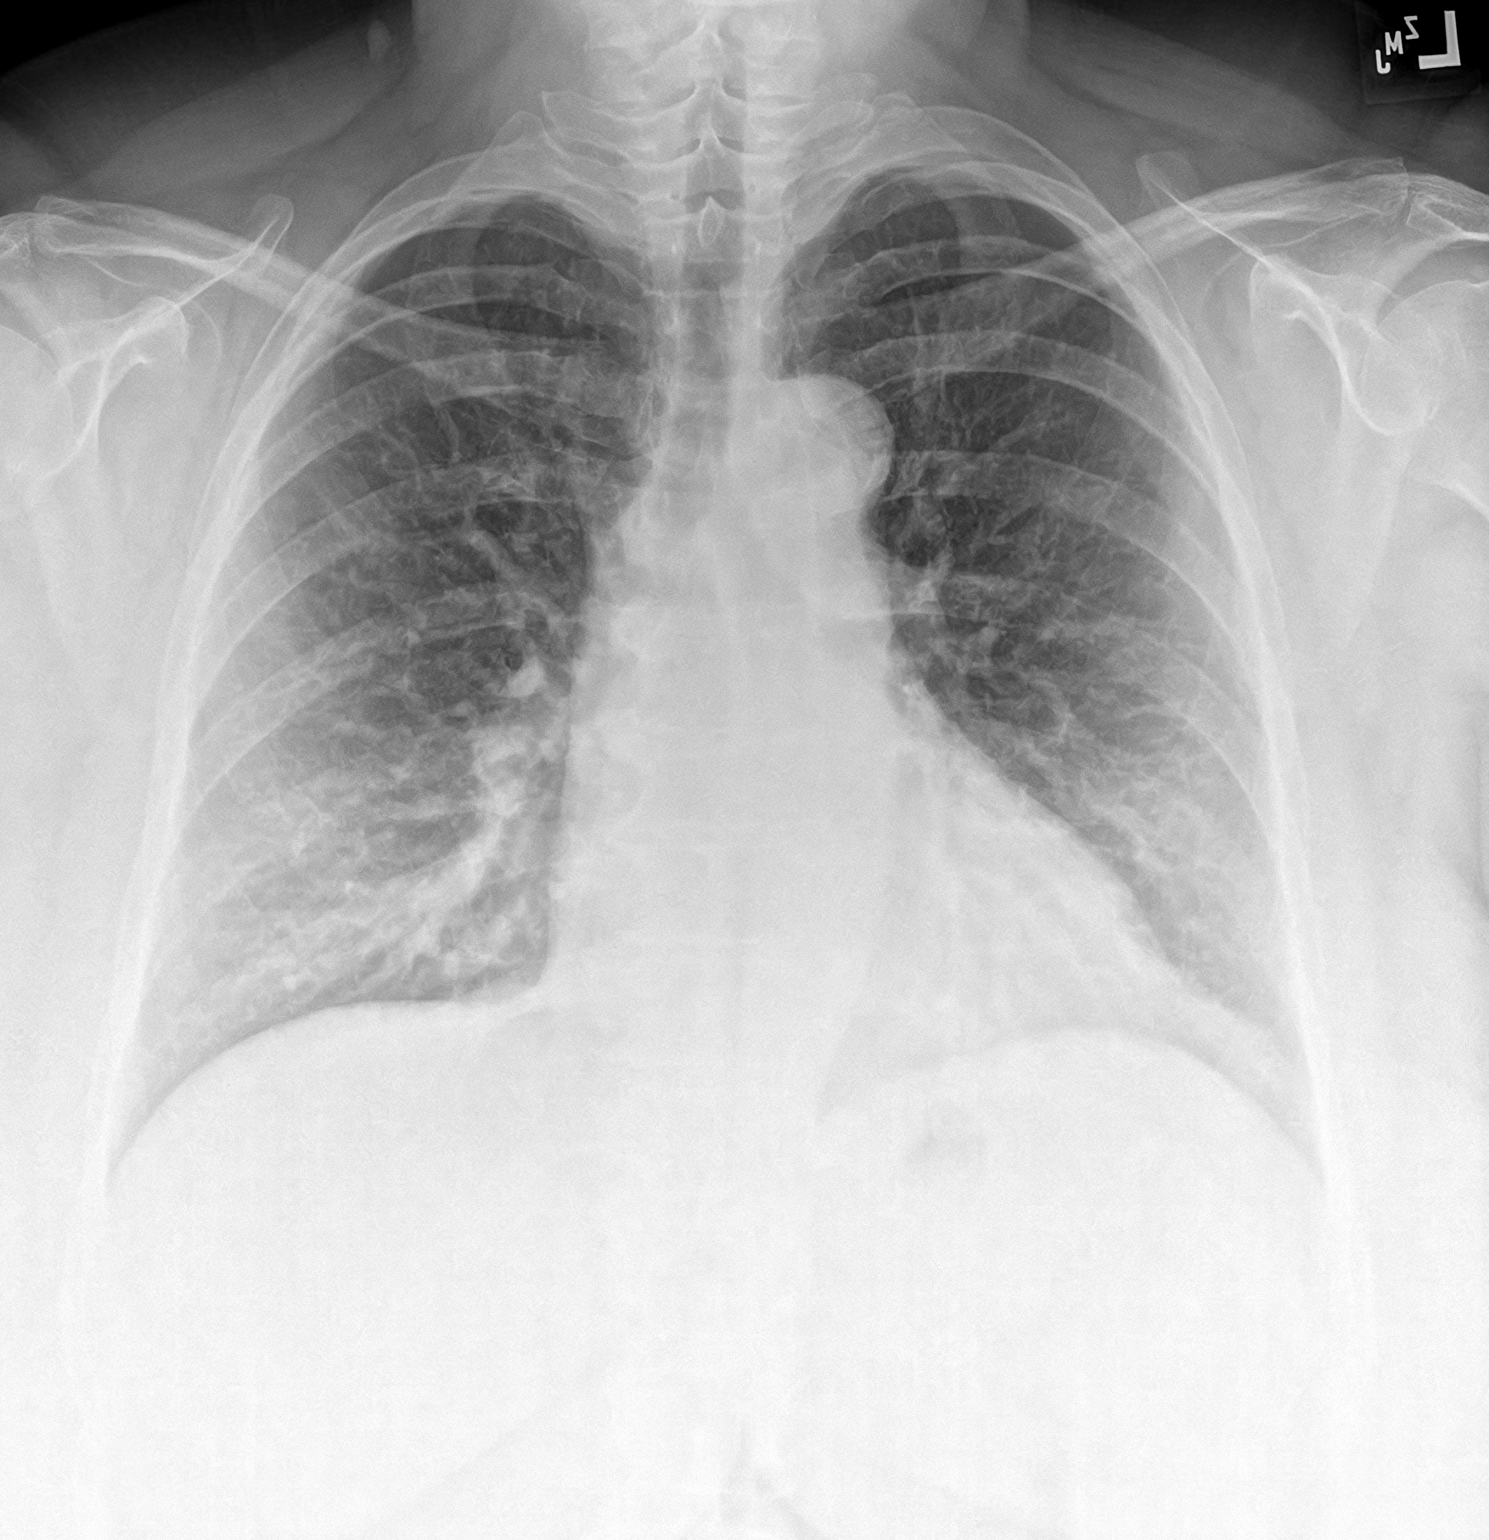

[chest lat]
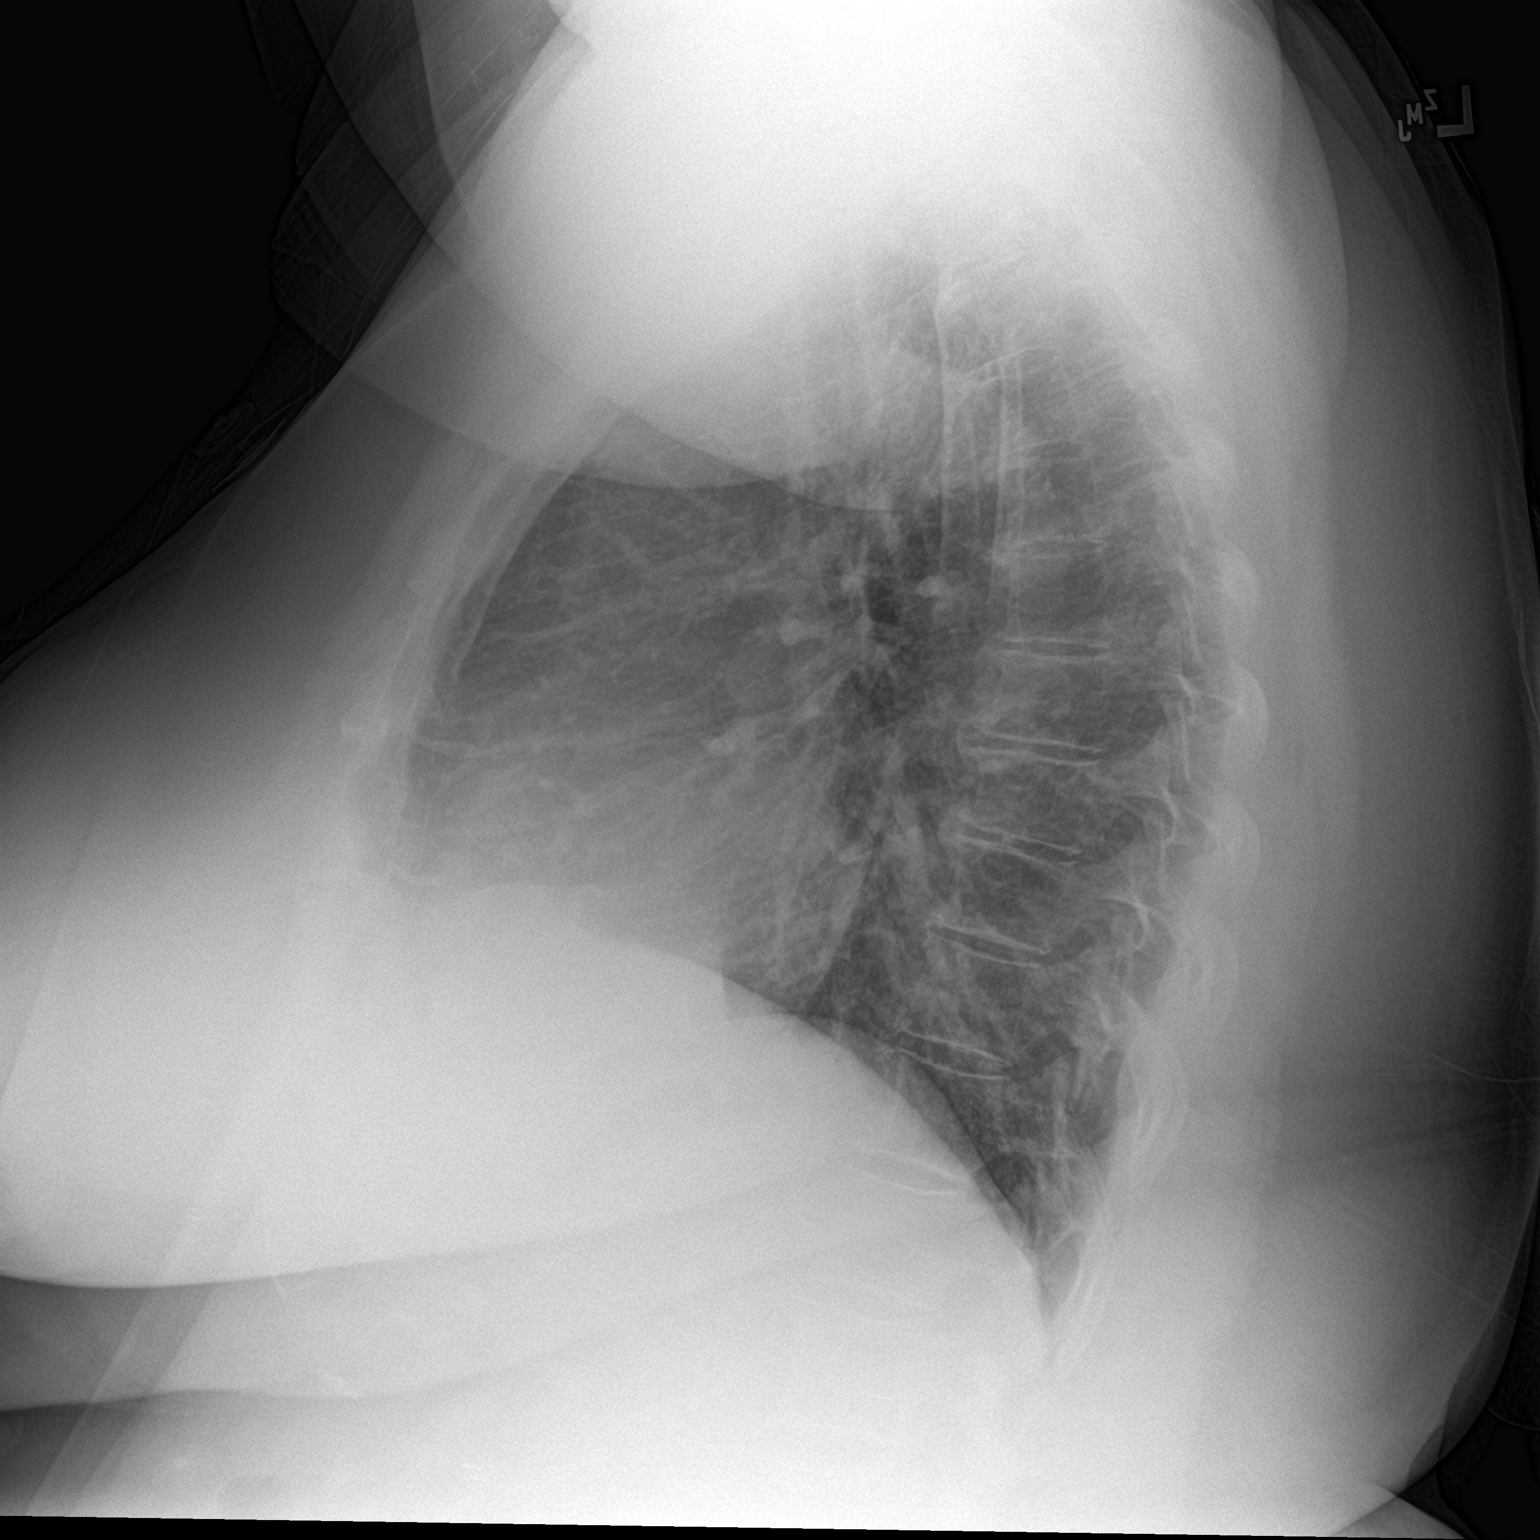

[2 of 2 positions shown; findings below may reference images not displayed]

FINDINGS: The heart size and mediastinal contours are within normal limits.
Atherosclerotic calcification of the aortic knob. Mildly increased
perihilar and bibasilar interstitial markings. Lungs are otherwise
clear. No pleural effusion or pneumothorax. The visualized skeletal
structures are unremarkable.
IMPRESSION: Mildly increased perihilar and bibasilar interstitial markings,
which may reflect bronchitic type lung changes or possibly mild
edema. No focal airspace consolidation.

## 2021-06-10 IMAGING — MR MR MRA NECK WO/W CM
3 of 4 series · 30 of 48 positions shown · IV contrast (gadavist)
Comparison: None.

CLINICAL DATA: Dizziness

EXAM:
MRI HEAD WITHOUT AND WITH CONTRAST
MRA HEAD WITHOUT CONTRAST
MRA NECK WITHOUT AND WITH CONTRAST
TECHNIQUE: Multiplanar, multiecho pulse sequences of the brain and surrounding
structures were obtained without and with intravenous contrast.
Angiographic images of the Circle of Willis were obtained using MRA
technique without intravenous contrast. Angiographic images of the
neck were obtained using MRA technique without and with intravenous
contrast. Carotid stenosis measurements (when applicable) are
obtained utilizing NASCET criteria, using the distal internal
carotid diameter as the denominator.
CONTRAST:  10mL GADAVIST GADOBUTROL 1 MMOL/ML IV SOLN

[Series 9: angio_fl3d_cor_pre_ttc=2.0s · coronal · B · 0.9mm · 0.85mm/px · 10 of 96 slices shown]
[im 1/96]
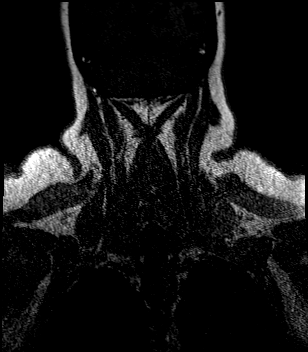
[im 11/96]
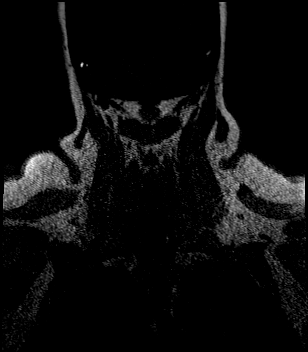
[im 22/96]
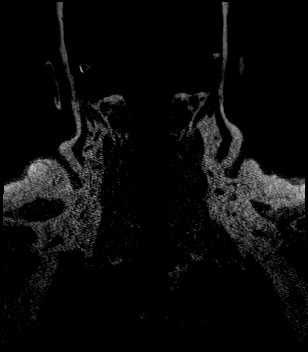
[im 32/96]
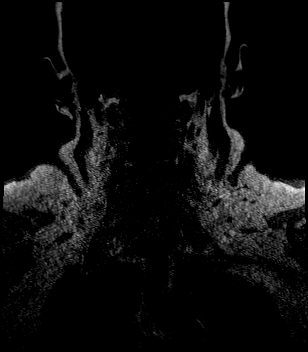
[im 43/96]
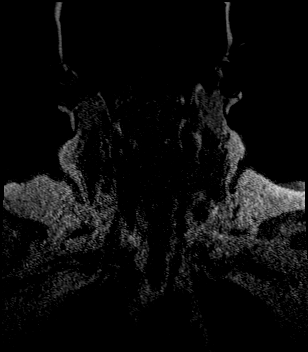
[im 53/96]
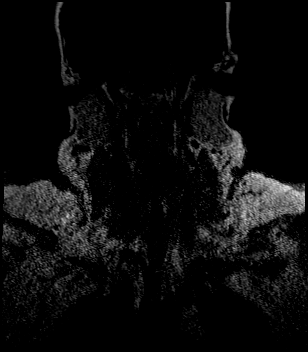
[im 64/96]
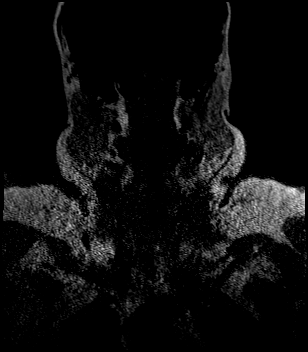
[im 74/96]
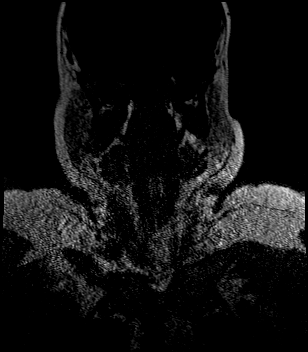
[im 85/96]
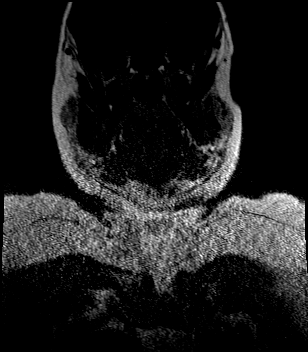
[im 96/96]
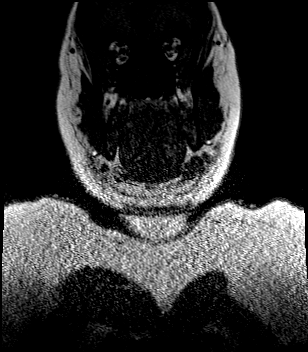

[Series 12: angio_fl3d_cor_post_ttc=2.0s_moco-adv · coronal · B · 0.9mm · 0.85mm/px · 10 of 96 slices shown]
[im 1/96]
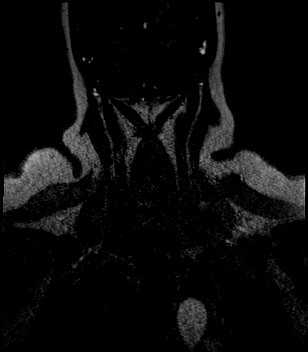
[im 11/96]
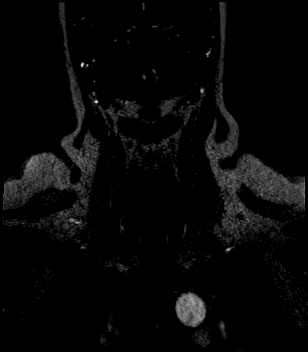
[im 22/96]
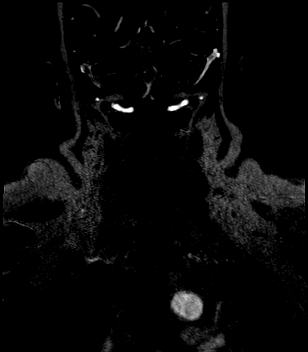
[im 32/96]
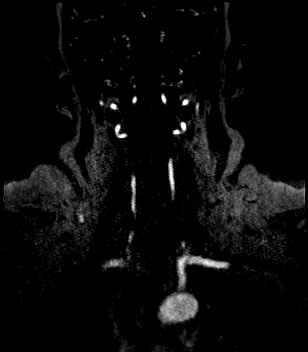
[im 43/96]
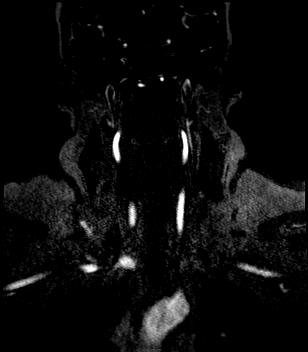
[im 53/96]
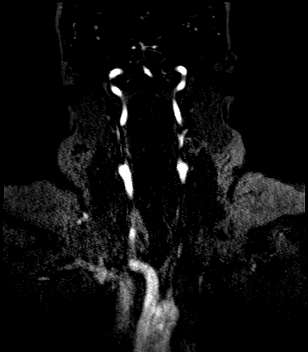
[im 64/96]
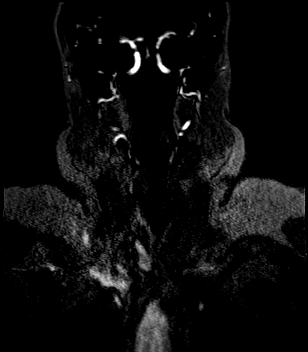
[im 74/96]
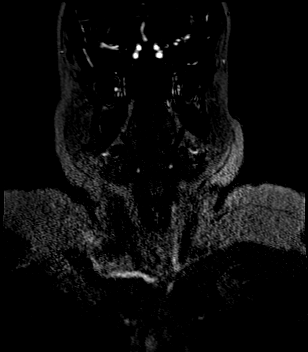
[im 85/96]
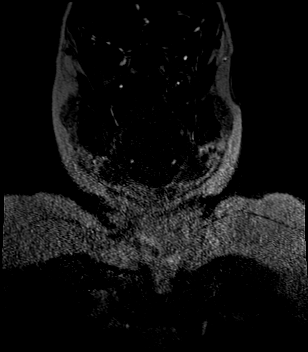
[im 96/96]
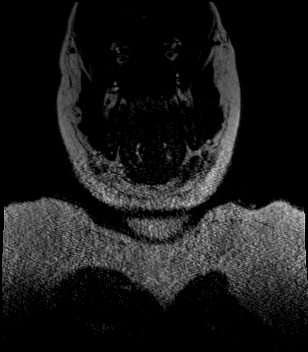

[Series 13: angio_fl3d_cor_post_ttc=2.0s_moco-adv_sub · coronal · B · 0.9mm · 0.85mm/px · 10 of 92 slices shown]
[im 1/92]
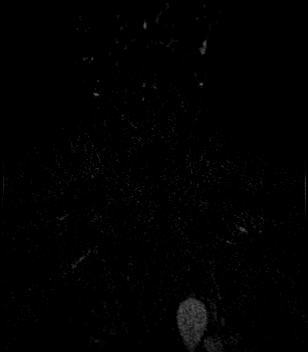
[im 11/92]
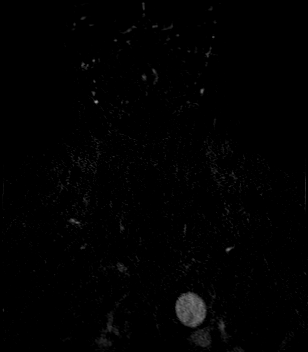
[im 21/92]
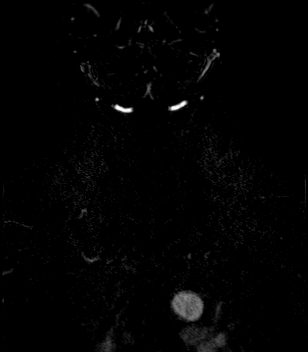
[im 31/92]
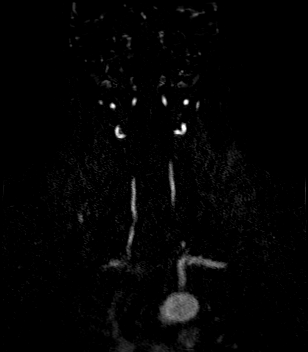
[im 41/92]
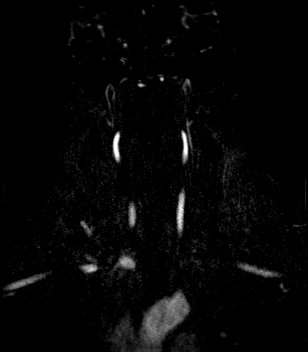
[im 51/92]
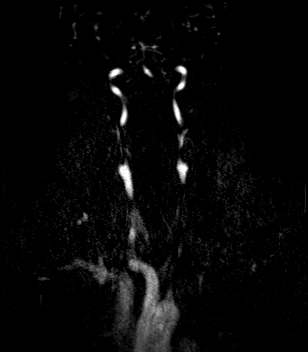
[im 61/92]
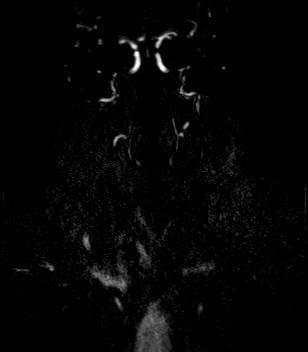
[im 71/92]
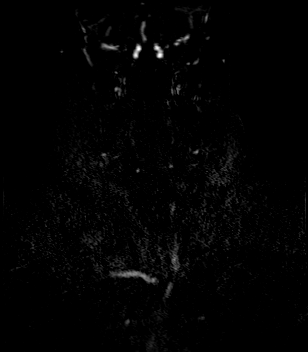
[im 81/92]
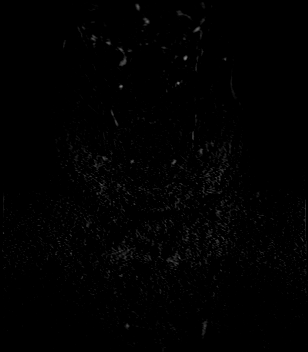
[im 92/92]
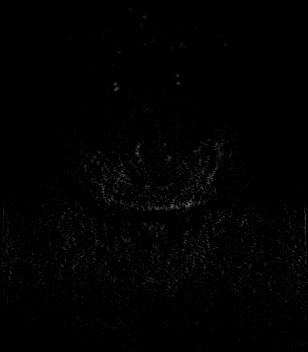

[30 of 48 positions shown; findings below may reference images not displayed]

FINDINGS: MRI HEAD FINDINGS

Brain: No acute infarction, hemorrhage, hydrocephalus, extra-axial
collection or mass lesion. Normal cerebral white matter. Normal
enhancement.

Vascular: Normal arterial flow voids.

Skull and upper cervical spine: Negative

Sinuses/Orbits: Negative

Other: None

MRA HEAD FINDINGS

Internal carotid artery widely patent bilaterally. Anterior and
middle cerebral arteries patent without large vessel occlusion or
significant stenosis.

Both vertebral arteries patent to the basilar. PICA patent
bilaterally. Basilar widely patent. AICA and superior cerebellar
arteries patent bilaterally. Posterior cerebral arteries patent
bilaterally. Fetal origin left posterior cerebral artery. Mild
irregular did left posterior cerebral artery could be artifact or
atherosclerotic disease.

Negative for cerebral aneurysm.

MRA NECK FINDINGS

Antegrade flow in the carotid and vertebral arteries bilaterally.

Carotid bifurcation is normal bilaterally. No carotid stenosis. Both
vertebral arteries are widely patent without stenosis.
IMPRESSION: 1. Negative MRI head with contrast
2. Mild irregularity left PCA which may be a mild atherosclerotic
disease versus artifact. Otherwise negative MRA head
3. Negative MRA neck

## 2021-06-10 MED ORDER — LORAZEPAM 2 MG/ML IJ SOLN
1.0000 mg | Freq: Once | INTRAMUSCULAR | Status: AC
  Administered 2021-06-10: 1 mg via INTRAVENOUS

## 2021-06-10 MED ORDER — DIAZEPAM 2 MG PO TABS: 2.0000 mg | ORAL_TABLET | Freq: Three times a day (TID) | ORAL | 0 refills | Status: DC | PRN

## 2021-06-10 MED ORDER — MECLIZINE HCL 25 MG PO TABS
25.0000 mg | ORAL_TABLET | Freq: Three times a day (TID) | ORAL | 0 refills | Status: DC | PRN
Start: 2021-06-10 — End: 2021-06-10

## 2021-06-10 MED ORDER — GADOBUTROL 1 MMOL/ML IV SOLN
10.0000 mL | Freq: Once | INTRAVENOUS | Status: AC | PRN
  Administered 2021-06-10: 10 mL via INTRAVENOUS

## 2021-06-10 MED ORDER — MECLIZINE HCL 25 MG PO TABS
25.0000 mg | ORAL_TABLET | Freq: Three times a day (TID) | ORAL | 0 refills | Status: DC | PRN
Start: 2021-06-10 — End: 2023-02-26

## 2021-06-10 MED ORDER — LORAZEPAM 2 MG/ML IJ SOLN
1.0000 mg | Freq: Once | INTRAMUSCULAR | Status: DC
  Filled 2021-06-10: qty 1

## 2021-06-10 NOTE — Discharge Instructions (Addendum)
Please follow-up with your primary care doctor.  Please return for increasing symptoms or headache or vomiting.  Try the Antivert 1 pill 3 times a day and the Valium 1 pill 3 times a day.  Both of these should help with the spinning sensation but they may make you very sleepy.  Do not drive on them.  The police may consider you an impaired driver.  Do not operate hazardous machinery on them.

## 2021-06-10 NOTE — ED Triage Notes (Signed)
Pt c/o dizziness for several days and last night had heart palpitations and pain radiating into the left arm for the past several days

## 2021-06-10 NOTE — ED Notes (Signed)
Pt ambulatory to restroom at this time. 

## 2021-06-10 NOTE — ED Provider Notes (Signed)
[facility] Emergency Department Provider Note   ____________________________________________   Event Date/Time   First MD Initiated Contact with Patient 06/10/21 1349     (approximate)  I have reviewed the triage vital signs and the nursing notes.   HISTORY  Chief Complaint Dizziness and Chest Pain   HPI Ruth Pennington is a 50 y.o. female patient reports intermittent brief episodes of vertigo.  These occur when she turns her head or rolls over in bed or occasionally when she stands up from sitting or lying.  They are usually very brief.  They are usually not associated with nausea or headaches.  Patient does have pain in the neck usually on the left side sometimes radiating up to the base of the skull when she turns her head and gets the vertigo. The symptoms have been ongoing intermittently for several days.  Patient also reports occasionally she will get a brief pain under the left arm across the chest that will last a minute or 2 and go away.  This can be with exertion or while she is sitting.  She is not having any trouble with shortness of breath.  She is not having any hearing loss   Past Medical History:  Diagnosis Date   Endometrial cancer ([place])    Endometrial cancer ([place])    Hypertension    Hypokalemia    Obesity, morbid ([place]) 01/09/2015    Patient Active Problem List   Diagnosis Date Noted   Shoulder blade pain 05/24/2020   History of endometrial cancer 05/26/2019   Bilateral nephrolithiasis 03/16/2018   Essential hypertension 01/09/2015   GERD without esophagitis 01/09/2015   Obesity, morbid ([place]) 01/09/2015    Past Surgical History:  Procedure Laterality Date   ABDOMINAL HYSTERECTOMY     cervix removed    APPENDECTOMY     CHOLECYSTECTOMY     LAPAROSCOPIC APPENDECTOMY N/A 03/06/2018   Procedure: APPENDECTOMY LAPAROSCOPIC;  Surgeon: Jeanie Cooks, MD;  Location: [facility] ORS;  Service: General;  Laterality: N/A;   OOPHORECTOMY  Bilateral 04/01/2018    Prior to Admission medications   Medication Sig Start Date End Date Taking? Authorizing Provider  diazepam (VALIUM) 2 MG tablet Take 1 tablet (2 mg total) by mouth every 8 (eight) hours as needed for anxiety. 06/10/21 06/10/22 Yes Nena Polio, MD  meclizine (ANTIVERT) 25 MG tablet Take 1 tablet (25 mg total) by mouth 3 (three) times daily as needed for dizziness. 06/10/21  Yes Nena Polio, MD  acetaminophen (TYLENOL) 500 MG tablet Take by mouth every 6 (six) hours as needed.  04/04/18   [provider]  benzonatate (TESSALON PERLES) 100 MG capsule Take 1 capsule (100 mg total) by mouth 3 (three) times daily as needed. 05/14/21   Lesleigh Noe, MD  ibuprofen (ADVIL,MOTRIN) 200 MG tablet Take by mouth every 6 (six) hours as needed.     [provider]  lisinopril (ZESTRIL) 10 MG tablet TAKE 1 TABLET(10 MG) BY MOUTH DAILY 03/22/21   Lesleigh Noe, MD  promethazine-dextromethorphan (PROMETHAZINE-DM) 6.25-15 MG/5ML syrup Take 5 mLs by mouth 4 (four) times daily as needed for cough. 05/14/21   Lesleigh Noe, MD    Allergies Patient has no known allergies.  Family History  Problem Relation Age of Onset   Hypertension Mother    Aortic stenosis Mother    Other Mother        Trigeminal neuralgia   Glaucoma Father    Hypertension Father  Diabetes Father    Prostate cancer Father    Prostate cancer Brother     Social History Social History   Tobacco Use   Smoking status: Never   Smokeless tobacco: Never  Substance Use Topics   Alcohol use: Yes    Comment: occassionally - 3 times a year   Drug use: Never    Review of Systems  Constitutional: No fever/chills Eyes: No visual changes. ENT: No sore throat. Cardiovascular: Denies chest pain. Respiratory: Denies shortness of breath. Gastrointestinal: No abdominal pain.  No nausea, no vomiting.  No diarrhea.  No constipation. Genitourinary: Negative for dysuria. Musculoskeletal:  Negative for back pain. Skin: Negative for rash. Neurological: Negative for headaches, focal weakness  ____________________________________________   PHYSICAL EXAM:  VITAL SIGNS: ED Triage Vitals  Enc Vitals Group     BP 06/10/21 0959 116/81     Pulse Rate 06/10/21 0959 (!) 107     Resp 06/10/21 0959 18     Temp 06/10/21 0959 97.8 F (36.6 C)     Temp Source 06/10/21 0959 Oral     SpO2 06/10/21 0959 97 %     Weight 06/10/21 0958 (!) 340 lb (154.2 kg)     Height 06/10/21 0958 5\' 6"  (1.676 m)     Head Circumference --      Peak Flow --      Pain Score 06/10/21 0958 0     Pain Loc --      Pain Edu? --      Excl. in [place]? --    Constitutional: Alert and oriented. Well appearing and in no acute distress. Eyes: Conjunctivae are normal. PERRL. EOMI. laying the patient back with her head slightly extended moving her head from side to side does not reproduce her symptoms.  Sitting her up afterwards does briefly reproduce her symptoms but only for about 5 to 10 seconds I am unable to see if there is any vertigo.  Thrust testing does not produce any vertigo and does seem to produce very slight eye deviation Head: Atraumatic. Ears: TMs are clear but difficult to see bilaterally Nose: No congestion/rhinnorhea. Mouth/Throat: Mucous membranes are moist.  Oropharynx non-erythematous. Neck: No stridor.  No cervical spine tenderness to palpation. Hematological/Lymphatic/Immunilogical: No cervical lymphadenopathy.  No palpable masses no bruits Cardiovascular: Normal rate, regular rhythm. Grossly normal heart sounds.  Good peripheral circulation. Respiratory: Normal respiratory effort.  No retractions. Lungs CTAB. Gastrointestinal: Soft and nontender. No distention. No abdominal bruits. No CVA tenderness. Musculoskeletal: No lower extremity tenderness nor edema.   Neurologic:  Normal speech and language. No gross focal neurologic deficits are appreciated.  Cranial nerves II through XII are intact  although visual fields were not checked cerebellar finger-to-nose is normal motor strength is 5/5 throughout and patient does not report any numbness.   Skin:  Skin is warm, dry and intact. No rash noted.  ____________________________________________   LABS (all labs ordered are listed, but only abnormal results are displayed)  Labs Reviewed  BASIC METABOLIC PANEL - Abnormal; Notable for the following components:      Result Value   Glucose, Bld 167 (*)    All other components within normal limits  CBC - Abnormal; Notable for the following components:   Hemoglobin 15.5 (*)    All other components within normal limits  BRAIN NATRIURETIC PEPTIDE  TROPONIN I (HIGH SENSITIVITY)  TROPONIN I (HIGH SENSITIVITY)   ____________________________________________  EKG  EKG read interpreted by me shows sinus tachycardia rate of 109 she  has left axis deviation some right bundle branch block with nonspecific ST-T wave changes.  These are very similar to EKG from 03/06/2018 ____________________________________________  RADIOLOGY Gertha Calkin, personally viewed and evaluated these images (plain radiographs) as part of my medical decision making, as well as reviewing the written report by the radiologist.  ED MD interpretation: Chest x-ray read by radiology reviewed by me may show some slightly increased interstitial markings.  With the patient not having any edema or any lung noises I think these are likely normal but just to make sure I will check a BNP.  Official radiology report(s): DG Chest 2 View  Result Date: 06/10/2021 CLINICAL DATA:  Chest pain, dizziness EXAM: CHEST - 2 VIEW COMPARISON:  None. FINDINGS: The heart size and mediastinal contours are within normal limits. Atherosclerotic calcification of the aortic knob. Mildly increased perihilar and bibasilar interstitial markings. Lungs are otherwise clear. No pleural effusion or pneumothorax. The visualized skeletal structures are  unremarkable. IMPRESSION: Mildly increased perihilar and bibasilar interstitial markings, which may reflect bronchitic type lung changes or possibly mild edema. No focal airspace consolidation. Electronically Signed   By: Davina Poke D.O.   On: 06/10/2021 10:48    ____________________________________________   PROCEDURES  Procedure(s) performed (including Critical Care):  Procedures   ____________________________________________   INITIAL IMPRESSION / ASSESSMENT AND PLAN / ED COURSE   Patient with normal EKG and troponin.  Chest pain does not seem to be associated particularly with exertion.  It is very brief.  The vertigo is not long-lasting seems to be associated with movement but is also associated with the pain in the neck.  I will get an MR a of the neck to make sure there is no carotid dissection or other problems.  I will add an MRA of the head and do an MRI of the brain as well to make sure this is not central vertigo.      ----------------------------------------- 4:08 PM on 06/10/2021 ----------------------------------------- Patient is awaiting MRI.  She seems to be somewhat better.  I will sign her out to Dr. Corky Downs anticipate she will have a negative MRI and be able to go home.  I anticipate she will likely be able to go home on Antivert and possibly low-dose Valium for a few days.  She can follow-up with her regular doctor.       ____________________________________________   FINAL CLINICAL IMPRESSION(S) / ED DIAGNOSES  Final diagnoses:  Dizziness     ED Discharge Orders          Ordered    meclizine (ANTIVERT) 25 MG tablet  3 times daily PRN        06/10/21 1531    diazepam (VALIUM) 2 MG tablet  Every 8 hours PRN        06/10/21 1531             Note:  This document was prepared using Dragon voice recognition software and may include unintentional dictation errors.   Nena Polio, MD 06/10/21 [phone]

## 2021-06-20 ENCOUNTER — Other Ambulatory Visit: Payer: Self-pay

## 2021-06-21 ENCOUNTER — Ambulatory Visit: Payer: BC Managed Care – PPO | Admitting: Family Medicine

## 2021-06-22 ENCOUNTER — Other Ambulatory Visit: Payer: Self-pay

## 2021-06-25 ENCOUNTER — Other Ambulatory Visit: Payer: Self-pay

## 2021-06-25 ENCOUNTER — Encounter: Payer: Self-pay | Admitting: Family Medicine

## 2021-06-25 ENCOUNTER — Ambulatory Visit: Payer: BC Managed Care – PPO | Admitting: Family Medicine

## 2021-06-25 DIAGNOSIS — H8110 Benign paroxysmal vertigo, unspecified ear: Secondary | ICD-10-CM

## 2021-06-25 DIAGNOSIS — R4589 Other symptoms and signs involving emotional state: Secondary | ICD-10-CM | POA: Insufficient documentation

## 2021-06-25 MED ORDER — OZEMPIC (0.25 OR 0.5 MG/DOSE) 2 MG/1.5ML ~~LOC~~ SOPN: 0.5000 mg | PEN_INJECTOR | SUBCUTANEOUS | 1 refills | Status: DC

## 2021-06-25 MED ORDER — OZEMPIC (0.25 OR 0.5 MG/DOSE) 2 MG/1.5ML ~~LOC~~ SOPN: 0.2500 mg | PEN_INJECTOR | SUBCUTANEOUS | 1 refills | Status: DC

## 2021-06-25 NOTE — Assessment & Plan Note (Signed)
Pt notes improvement in symptoms. Not interested in treatment at this time. Will continue to monitor. F/u as needed.

## 2021-06-25 NOTE — Patient Instructions (Addendum)
Brandt-Daroff Exercise Here's what you need to do for this exercise:  Start in an upright, seated position on your bed. Tilt your head around a 45-degree angle away from the side causing your vertigo. Move into the lying position on one side with your nose pointed up. Stay in this position for about 30 seconds or until the vertigo eases off, whichever is longer. Then move back to the seated position. Repeat on the other side. You should do these movements from three to five times in a session. You should have three sessions a day for up to 2 weeks, or until the vertigo is gone for 2 days.   Ozempic  - will hopefull get this approved - start with 0.25 mg  - message in 3-4 weeks if tolerating and will increase the dose - plan to return for 3 month follow-up  #Referral I have placed a referral to a specialist for you. You should receive a phone call from the specialty office. Make sure your voicemail is not full and that if you are able to answer your phone to unknown or new numbers.   It may take up to 2 weeks to hear about the referral. If you do not hear anything in 2 weeks, please call our office and ask to speak with the referral coordinator.

## 2021-06-25 NOTE — Assessment & Plan Note (Signed)
Reviewed normal ER work-up for chest pain and dizziness including normal MRI. Offered PT but she will start with home exercises and reach out if wanting to try PT. Cont meclizine and diazepam prn.

## 2021-06-25 NOTE — Assessment & Plan Note (Signed)
Long hx of attempts at weight loss with calorie counting and diet changes. Not interested in surgery. Not a candidate for phentermine due to HTN. Advised ozempic (if covered) and nutrition referral as well as regular exercise.

## 2021-06-25 NOTE — Progress Notes (Signed)
Subjective:     Ruth Pennington is a 50 y.o. female presenting for mood (Follow up for mood ) and diet/exercise     HPI  #Er visit - normal EKG/Troponin - head/neck/brain - MRI - positional vertigo  - was given meclizine and diazepam to help - but takes rarely - will get it with leaning back and turning the head - resolves quickly  #Mood - has not done therapy - feels this is slightly better - has found things to help with mood - many of this comes from having 2 adult daughters and worrying   #weight loss - tried to check with insurance company -    Review of Systems  03/22/2021: Clinic - obesity - exercise/diet (consider ozempic). Mood - therapy  Social History   Tobacco Use  Smoking Status Never  Smokeless Tobacco Never        Objective:    BP Readings from Last 3 Encounters:  06/25/21 128/64  06/10/21 120/76  05/14/21 140/89   Wt Readings from Last 3 Encounters:  06/25/21 (!) 336 lb (152.4 kg)  06/10/21 (!) 340 lb (154.2 kg)  05/14/21 (!) 340 lb (154.2 kg)    BP 128/64   Pulse (!) 103   Temp 98.3 F (36.8 C) (Temporal)   Ht '5\' 6"'$  (1.676 m)   Wt (!) 336 lb (152.4 kg)   SpO2 95%   BMI 54.23 kg/m    Physical Exam Constitutional:      General: She is not in acute distress.    Appearance: She is well-developed. She is not diaphoretic.  HENT:     Right Ear: External ear normal.     Left Ear: External ear normal.     Nose: Nose normal.  Eyes:     Conjunctiva/sclera: Conjunctivae normal.  Cardiovascular:     Rate and Rhythm: Normal rate.  Pulmonary:     Effort: Pulmonary effort is normal.  Musculoskeletal:     Cervical back: Neck supple.  Skin:    General: Skin is warm and dry.     Capillary Refill: Capillary refill takes less than 2 seconds.  Neurological:     Mental Status: She is alert. Mental status is at baseline.  Psychiatric:        Mood and Affect: Mood normal.        Behavior: Behavior normal.     Depression screen  [facility] 2/9 03/22/2021 10/30/2018  Decreased Interest 1 0  Down, Depressed, Hopeless 0 0  PHQ - 2 Score 1 0  Altered sleeping 1 1  Tired, decreased energy 1 1  Change in appetite 2 0  Feeling bad or failure about yourself  0 1  Trouble concentrating 1 1  Moving slowly or fidgety/restless 0 0  Suicidal thoughts 0 0  PHQ-9 Score 6 4  Difficult doing work/chores Not difficult at all Not difficult at all          Assessment & Plan:   Problem List Items Addressed This Visit       Nervous and Auditory   BPPV (benign paroxysmal positional vertigo)    Reviewed normal ER work-up for chest pain and dizziness including normal MRI. Offered PT but she will start with home exercises and reach out if wanting to try PT. Cont meclizine and diazepam prn.          Other   Obesity, morbid ([place]) - Primary    Long hx of attempts at weight loss with calorie counting and diet  changes. Not interested in surgery. Not a candidate for phentermine due to HTN. Advised ozempic (if covered) and nutrition referral as well as regular exercise.        Relevant Medications   Semaglutide,0.25 or 0.'5MG'$ /DOS, (OZEMPIC, 0.25 OR 0.5 MG/DOSE,) 2 MG/1.5ML SOPN   Other Relevant Orders   Ambulatory referral to Nutrition and Diabetic Education   Depressed mood    Pt notes improvement in symptoms. Not interested in treatment at this time. Will continue to monitor. F/u as needed.          Return in about 3 months (around 09/25/2021).  Lesleigh Noe, MD  This visit occurred during the SARS-CoV-2 public health emergency.  Safety protocols were in place, including screening questions prior to the visit, additional usage of staff PPE, and extensive cleaning of exam room while observing appropriate contact time as indicated for disinfecting solutions.

## 2021-06-27 NOTE — Telephone Encounter (Signed)
Routing to MA to work on [employer]

## 2021-06-28 MED ORDER — SEMAGLUTIDE-WEIGHT MANAGEMENT 0.25 MG/0.5ML ~~LOC~~ SOAJ: 0.2500 mg | SUBCUTANEOUS | 0 refills | Status: DC

## 2021-06-28 MED ORDER — WEGOVY 0.25 MG/0.5ML ~~LOC~~ SOAJ: 0.2500 mg | SUBCUTANEOUS | 0 refills | Status: DC

## 2021-06-28 NOTE — Telephone Encounter (Signed)
Rx for [facility] sent to Pharmacy.  Will wait to hear from pharmacy for PA request.

## 2021-06-28 NOTE — Addendum Note (Signed)
Addended by: Carter Kitten on: 06/28/2021 02:13 PM   Modules accepted: Orders

## 2021-06-28 NOTE — Addendum Note (Signed)
Addended by: Waunita Schooner R on: 06/28/2021 01:52 PM   Modules accepted: Orders

## 2021-06-28 NOTE — Telephone Encounter (Addendum)
Please send in RX for [facility] and mark DAW.  PA will be denied if sent in under semaglutide and diagnosis is not diabetes.

## 2021-07-06 ENCOUNTER — Encounter: Payer: Self-pay | Admitting: Family Medicine

## 2021-07-09 NOTE — Telephone Encounter (Signed)
PA submitted for [facility] 0.'25mg'$ /0.8m has been denied [facility] [place] Letter states that the medication is not covered for the member bases on current plan benefits.

## 2021-07-12 ENCOUNTER — Encounter: Payer: Self-pay | Admitting: Family Medicine

## 2021-07-29 ENCOUNTER — Encounter: Payer: Self-pay | Admitting: Family Medicine

## 2021-08-06 NOTE — Telephone Encounter (Signed)
Called and scheduled pt for 3:40 on 9/13

## 2021-08-06 NOTE — Telephone Encounter (Signed)
Skylene would like a phone call back to discuss getting a work in appt. Pt has been scheduled for 9/19

## 2021-08-06 NOTE — Telephone Encounter (Signed)
Please add on daughter tomorrow 08/07/2021 - 340 just opened, but can add on during any morning block if the 340 gets filled

## 2022-01-11 ENCOUNTER — Ambulatory Visit (INDEPENDENT_AMBULATORY_CARE_PROVIDER_SITE_OTHER): Payer: BC Managed Care – PPO

## 2022-01-11 ENCOUNTER — Ambulatory Visit
Admission: RE | Admit: 2022-01-11 | Discharge: 2022-01-11 | Disposition: A | Discharge status: Home / Self Care | Payer: BC Managed Care – PPO | Source: Ambulatory Visit | Attending: Family Medicine | Admitting: Family Medicine

## 2022-01-11 VITALS — BP 143/90 | HR 82 | Temp 98.9°F | Resp 22

## 2022-01-11 DIAGNOSIS — S29011A Strain of muscle and tendon of front wall of thorax, initial encounter: Secondary | ICD-10-CM | POA: Diagnosis not present

## 2022-01-11 DIAGNOSIS — R079 Chest pain, unspecified: Secondary | ICD-10-CM | POA: Diagnosis not present

## 2022-01-11 IMAGING — DX DG CHEST 2V
3 series · 3 of 3 positions shown · non-contrast
Comparison: [DATE].

CLINICAL DATA: Chest pain.

EXAM:
CHEST - 2 VIEW

[chest pa]
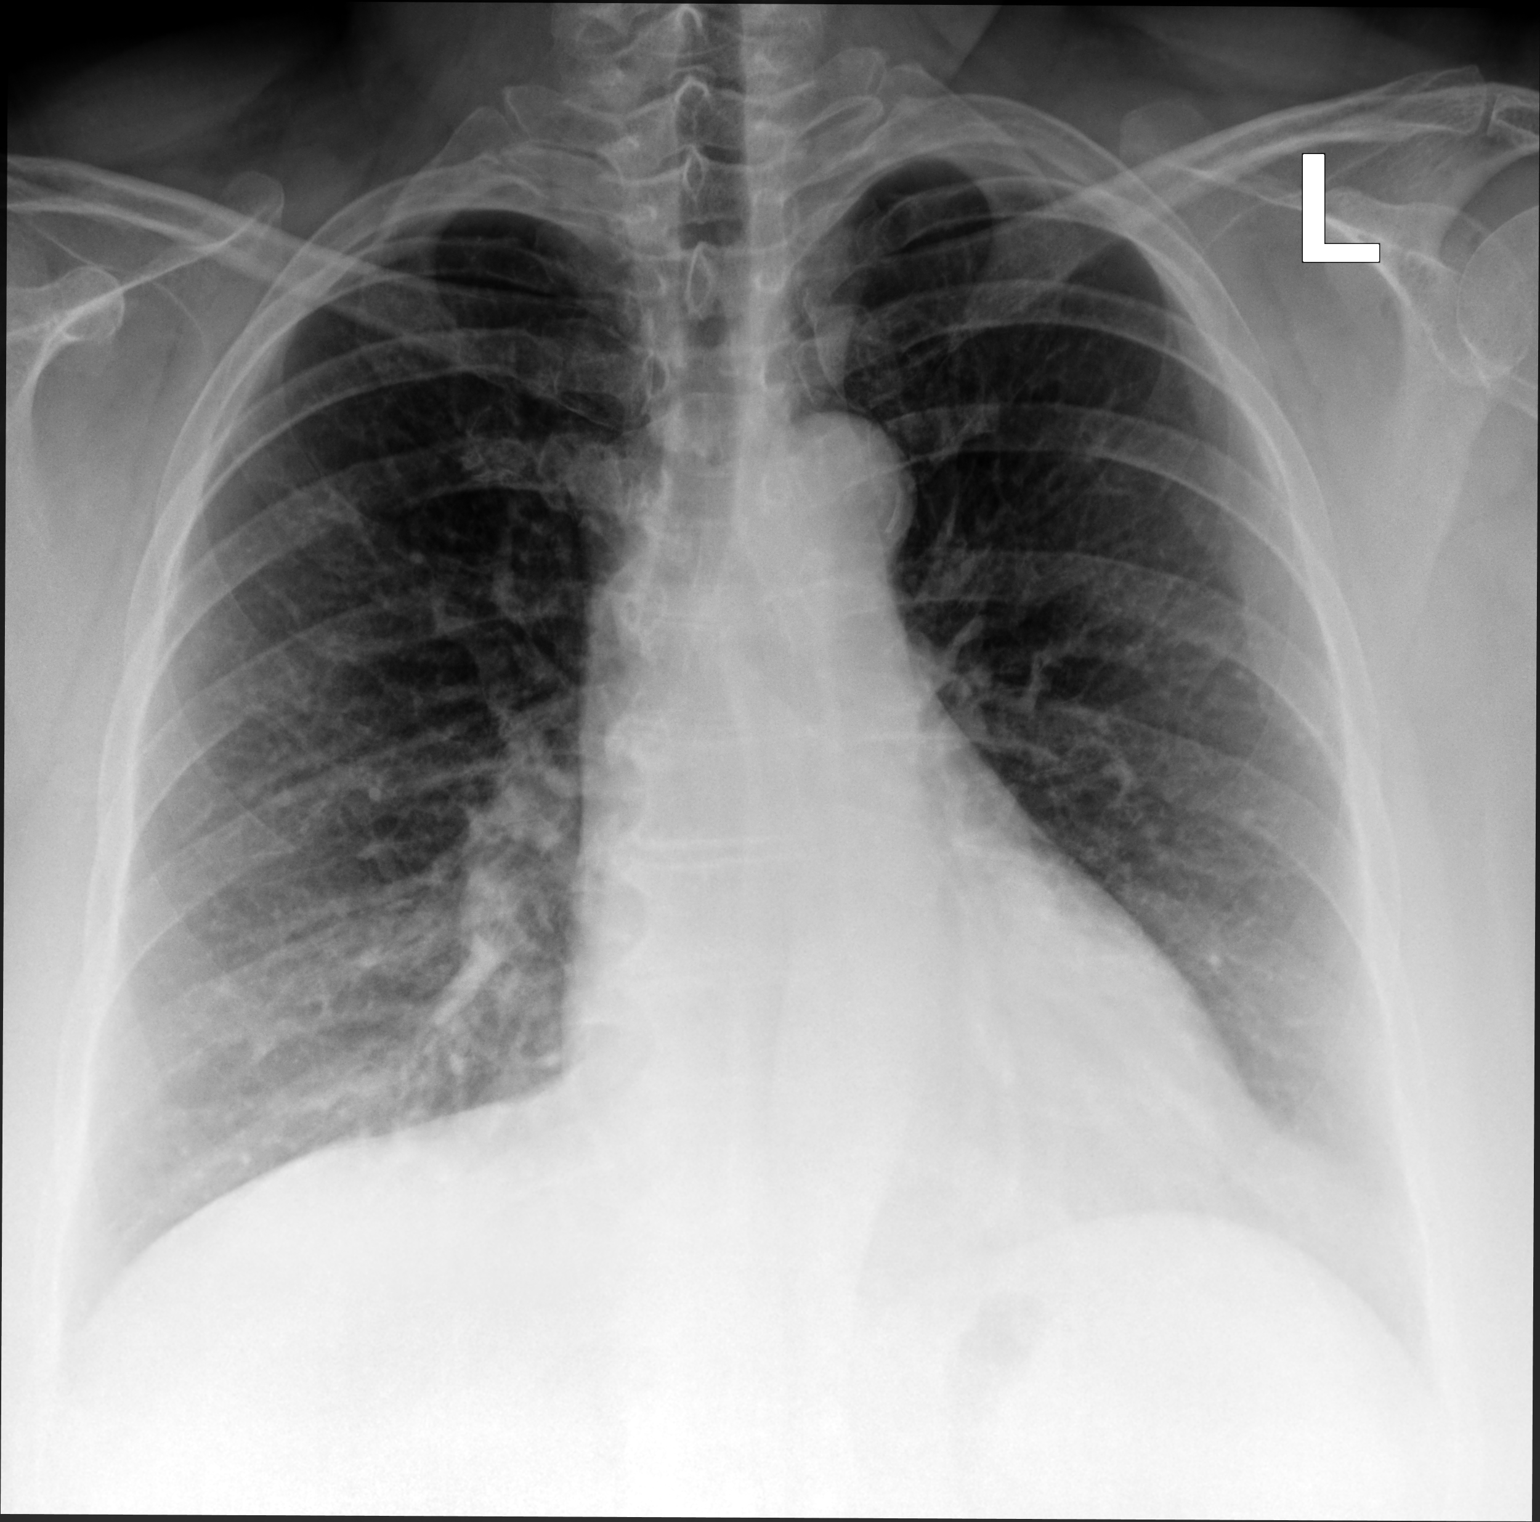

[hemithorax (ribs) ap]
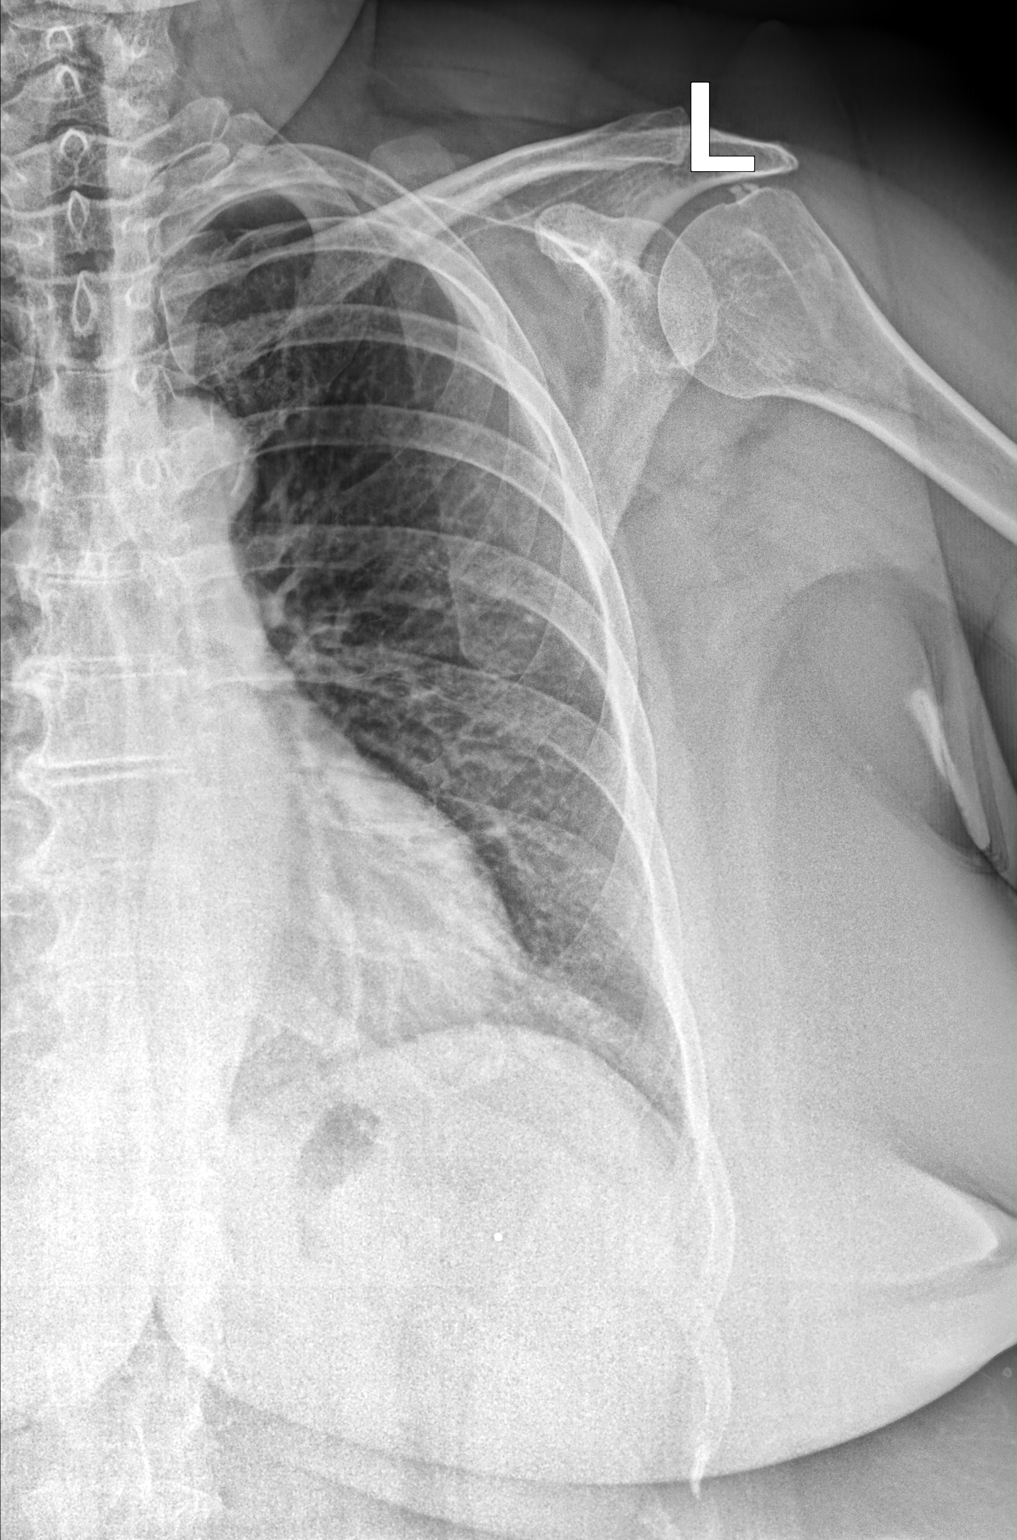

[hemithorax (ribs) mlo]
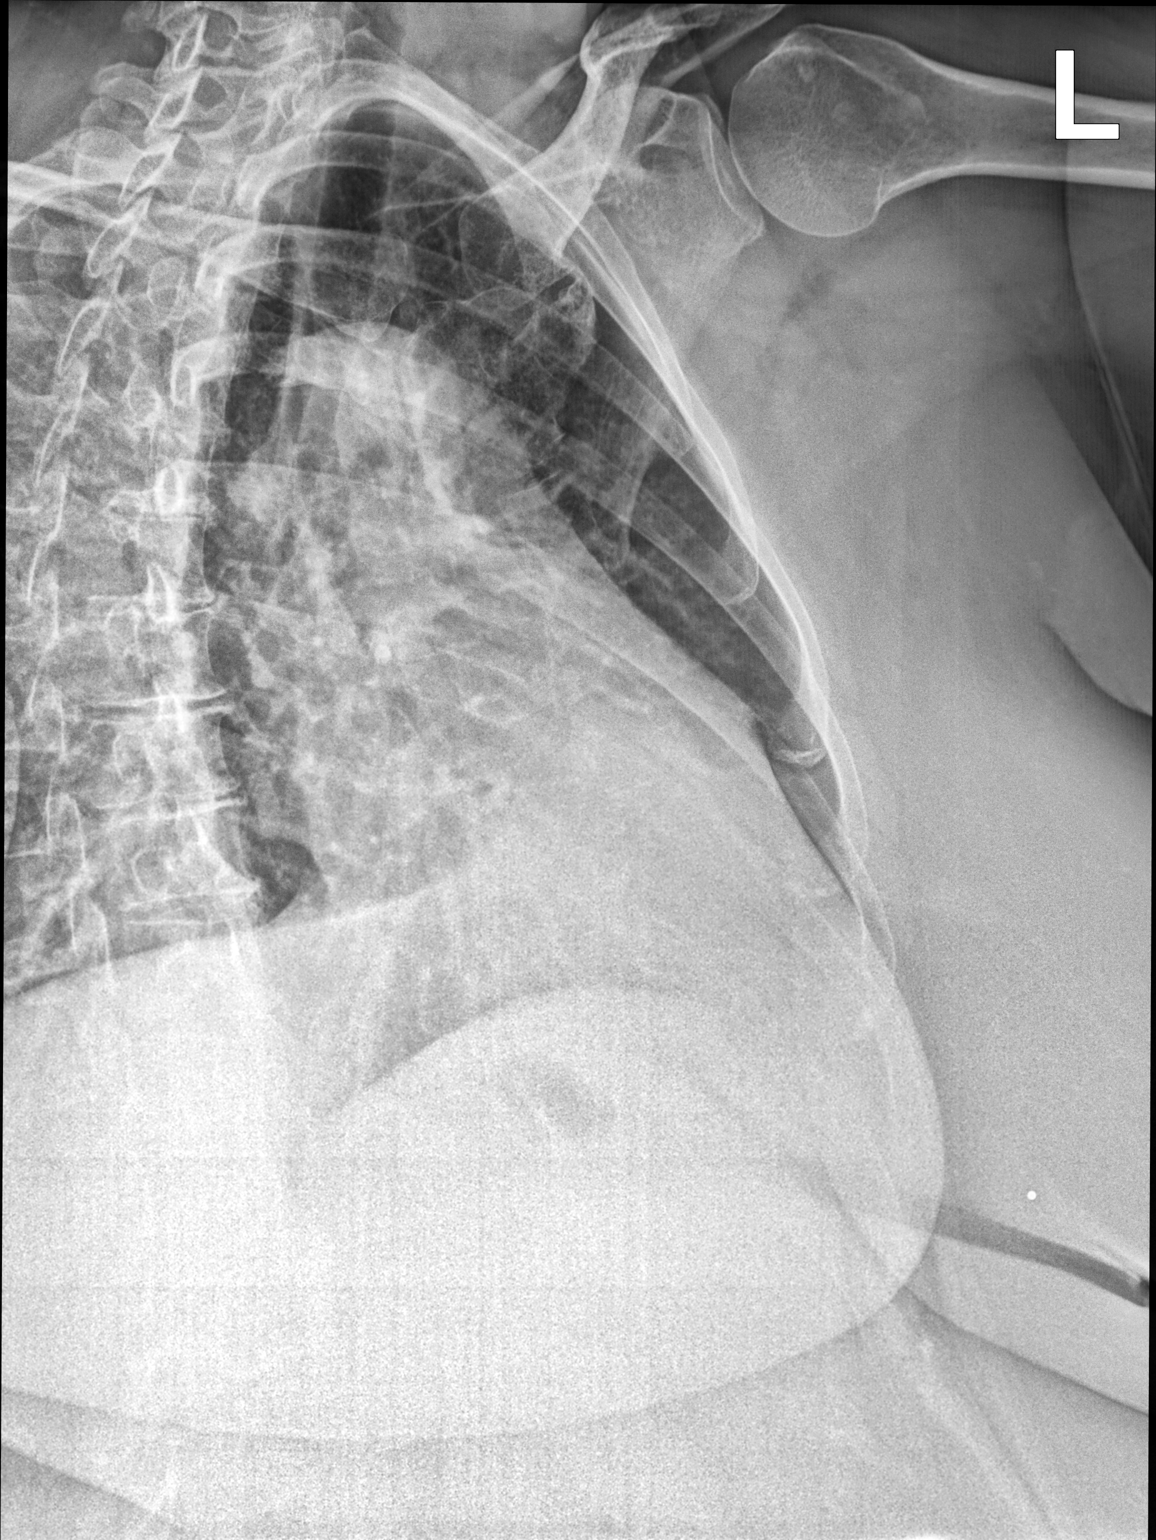

[3 of 3 positions shown; findings below may reference images not displayed]

FINDINGS: The heart size and mediastinal contours are within normal limits.
Both lungs are clear. The visualized skeletal structures are
unremarkable.
IMPRESSION: No active cardiopulmonary disease.

## 2022-01-11 MED ORDER — KETOROLAC TROMETHAMINE 30 MG/ML IJ SOLN
30.0000 mg | Freq: Once | INTRAMUSCULAR | Status: AC
  Administered 2022-01-11: 30 mg via INTRAMUSCULAR

## 2022-01-11 MED ORDER — DEXAMETHASONE SODIUM PHOSPHATE 10 MG/ML IJ SOLN
10.0000 mg | Freq: Once | INTRAMUSCULAR | Status: AC
  Administered 2022-01-11: 10 mg via INTRAMUSCULAR

## 2022-01-11 MED ORDER — CYCLOBENZAPRINE HCL 10 MG PO TABS: 10.0000 mg | ORAL_TABLET | Freq: Two times a day (BID) | ORAL | 0 refills | Status: DC | PRN

## 2022-01-11 NOTE — Discharge Instructions (Addendum)
Continue Naproxen as needed for pain.  You may take Flexeril up to twice daily as needed for acute pain.  Continue warm applications.  Your chest x-ray was negative for any sternal fracture.  If your symptoms do not readily improve within the next 7 days.  Follow-up with your primary care provider for further work-up.

## 2022-01-11 NOTE — ED Triage Notes (Signed)
Pt here after pulling a muscle right under left breast between ribs 5 days ago while bending over to get a book. Pt states it hurts slightly at rest but is exacerbated during movement.

## 2022-01-11 NOTE — ED Provider Notes (Addendum)
Ruth Pennington    CSN: [id number] Arrival date & time: 01/11/22  4235      History   Chief Complaint Chief Complaint  Patient presents with   Muscle Pain    HPI Ruth Pennington is a 51 y.o. female.   HPI Patient with a medical history significant for obesity, recurrent shoulder pain, presents today for evaluation of muscle pain x 5 days. Pain is located left chest wall underneath left breast.  Pain is exacerbated by movement and is reproducible with palpation. Pain is specifically present lower left sternal boarder and mid sternal region. Extending right arm increases pain. She has taken naproxen and tramadol with minimal relief.   The pain has increased gradually over the last few days. Concern she may have broken a rib.   Past Medical History:  Diagnosis Date   Endometrial cancer ([place])    Endometrial cancer ([place])    Hypertension    Hypokalemia    Obesity, morbid ([place]) 01/09/2015    Patient Active Problem List   Diagnosis Date Noted   Depressed mood 06/25/2021   BPPV (benign paroxysmal positional vertigo) 06/25/2021   Shoulder blade pain 05/24/2020   History of endometrial cancer 05/26/2019   Bilateral nephrolithiasis 03/16/2018   Essential hypertension 01/09/2015   GERD without esophagitis 01/09/2015   Obesity, morbid ([place]) 01/09/2015    Past Surgical History:  Procedure Laterality Date   ABDOMINAL HYSTERECTOMY     cervix removed    APPENDECTOMY     CHOLECYSTECTOMY     LAPAROSCOPIC APPENDECTOMY N/A 03/06/2018   Procedure: APPENDECTOMY LAPAROSCOPIC;  Surgeon: Jeanie Cooks, MD;  Location: [facility] ORS;  Service: General;  Laterality: N/A;   OOPHORECTOMY Bilateral 04/01/2018    OB History   No obstetric history on file.      Home Medications    Prior to Admission medications   Medication Sig Start Date End Date Taking? Authorizing Provider  cyclobenzaprine (FLEXERIL) 10 MG tablet Take 1 tablet (10 mg total) by mouth 2 (two) times daily as needed  for muscle spasms. 01/11/22  Yes Scot Jun, FNP  acetaminophen (TYLENOL) 500 MG tablet Take by mouth every 6 (six) hours as needed.  04/04/18   [provider]  diazepam (VALIUM) 2 MG tablet Take 1 tablet (2 mg total) by mouth every 8 (eight) hours as needed for anxiety. 06/10/21 06/10/22  Nena Polio, MD  ibuprofen (ADVIL,MOTRIN) 200 MG tablet Take by mouth every 6 (six) hours as needed.     [provider]  lisinopril (ZESTRIL) 10 MG tablet TAKE 1 TABLET(10 MG) BY MOUTH DAILY 03/22/21   Lesleigh Noe, MD  meclizine (ANTIVERT) 25 MG tablet Take 1 tablet (25 mg total) by mouth 3 (three) times daily as needed for dizziness. 06/10/21   Lavonia Drafts, MD  promethazine-dextromethorphan (PROMETHAZINE-DM) 6.25-15 MG/5ML syrup Take 5 mLs by mouth 4 (four) times daily as needed for cough. 05/14/21   Lesleigh Noe, MD  Semaglutide-Weight Management (WEGOVY) 0.25 MG/0.5ML SOAJ Inject 0.25 mg into the skin once a week. 06/28/21   Lesleigh Noe, MD    Family History Family History  Problem Relation Age of Onset   Hypertension Mother    Aortic stenosis Mother    Other Mother        Trigeminal neuralgia   Glaucoma Father    Hypertension Father    Diabetes Father    Prostate cancer Father    Prostate cancer Brother     Social History  Social History   Tobacco Use   Smoking status: Never   Smokeless tobacco: Never  Substance Use Topics   Alcohol use: Yes    Comment: occassionally - 3 times a year   Drug use: Never     Allergies   Patient has no known allergies.   Review of Systems Review of Systems Pertinent negatives listed in HPI   Physical Exam Triage Vital Signs ED Triage Vitals  Enc Vitals Group     BP      Pulse      Resp      Temp      Temp src      SpO2      Weight      Height      Head Circumference      Peak Flow      Pain Score      Pain Loc      Pain Edu?      Excl. in [place]?    No data found.  Updated Vital Signs BP (!) 143/90     Pulse 82    Temp 98.9 F (37.2 C) (Temporal)    Resp (!) 22    SpO2 98%   Visual Acuity Right Eye Distance:   Left Eye Distance:   Bilateral Distance:    Right Eye Near:   Left Eye Near:    Bilateral Near:     Physical Exam Constitutional:      Appearance: She is obese.  HENT:     Head: Normocephalic and atraumatic.  Eyes:     Extraocular Movements: Extraocular movements intact.     Pupils: Pupils are equal, round, and reactive to light.  Cardiovascular:     Rate and Rhythm: Normal rate and regular rhythm.  Pulmonary:     Effort: Pulmonary effort is normal.     Breath sounds: Normal breath sounds.  Chest:    Skin:    General: Skin is warm and dry.  Neurological:     General: No focal deficit present.     Mental Status: She is alert.  Psychiatric:        Mood and Affect: Mood normal.        Behavior: Behavior normal.        Thought Content: Thought content normal.        Judgment: Judgment normal.     UC Treatments / Results  Labs (all labs ordered are listed, but only abnormal results are displayed) Labs Reviewed - No data to display  EKG   Radiology DG Chest 2 View  Result Date: 01/11/2022 CLINICAL DATA:  Chest pain. EXAM: CHEST - 2 VIEW COMPARISON:  June 10, 2021. FINDINGS: The heart size and mediastinal contours are within normal limits. Both lungs are clear. The visualized skeletal structures are unremarkable. IMPRESSION: No active cardiopulmonary disease. Electronically Signed   By: Marijo Conception M.D.   On: 01/11/2022 08:54    Procedures Procedures (including critical care time)  Medications Ordered in UC Medications  ketorolac (TORADOL) 30 MG/ML injection 30 mg (has no administration in time range)  dexamethasone (DECADRON) injection 10 mg (has no administration in time range)    Initial Impression / Assessment and Plan / UC Course  I have reviewed the triage vital signs and the nursing notes.  Pertinent labs & imaging results that were  available during my care of the patient were reviewed by me and considered in my medical decision making (see chart for details).  Also strain involving the chest wall.  Imaging negative for any sternal fracture.  Decadron IM and Toradol IM given here in clinic today.  Patient encouraged to continue anti-inflammatories at home as needed. Flexeril 10 mg twice daily as needed for acute pain.  Patient advised to follow-up with primary care provider if symptoms persist beyond 7 days. Return to UC as needed. Final Clinical Impressions(s) / UC Diagnoses   Final diagnoses:  Muscle strain of chest wall, initial encounter     Discharge Instructions      Continue Naproxen as needed for pain.  You may take Flexeril up to twice daily as needed for acute pain.  Continue warm applications.  Your chest x-ray was negative for any sternal fracture.  If your symptoms do not readily improve within the next 7 days.  Follow-up with your primary care provider for further work-up.      ED Prescriptions     Medication Sig Dispense Auth. Provider   cyclobenzaprine (FLEXERIL) 10 MG tablet Take 1 tablet (10 mg total) by mouth 2 (two) times daily as needed for muscle spasms. 20 tablet Scot Jun, FNP      PDMP not reviewed this encounter.   Scot Jun, FNP 01/11/22 0907    Scot Jun, FNP 01/11/22 [place], [place], FNP 01/11/22 1053

## 2022-03-21 ENCOUNTER — Ambulatory Visit (INDEPENDENT_AMBULATORY_CARE_PROVIDER_SITE_OTHER): Payer: BC Managed Care – PPO | Admitting: Family Medicine

## 2022-03-21 VITALS — BP 130/80 | HR 101 | Temp 98.1°F | Ht 65.75 in | Wt 349.0 lb

## 2022-03-21 DIAGNOSIS — Z1231 Encounter for screening mammogram for malignant neoplasm of breast: Secondary | ICD-10-CM

## 2022-03-21 DIAGNOSIS — Z0000 Encounter for general adult medical examination without abnormal findings: Secondary | ICD-10-CM

## 2022-03-21 DIAGNOSIS — R5383 Other fatigue: Secondary | ICD-10-CM | POA: Diagnosis not present

## 2022-03-21 DIAGNOSIS — I10 Essential (primary) hypertension: Secondary | ICD-10-CM | POA: Diagnosis not present

## 2022-03-21 DIAGNOSIS — Z1211 Encounter for screening for malignant neoplasm of colon: Secondary | ICD-10-CM

## 2022-03-21 LAB — CBC
HCT: 43.9 % (ref 36.0–46.0)
Hemoglobin: 14.9 g/dL (ref 12.0–15.0)
MCHC: 34 g/dL (ref 30.0–36.0)
MCV: 91.8 fl (ref 78.0–100.0)
Platelets: 267 10*3/uL (ref 150.0–400.0)
RBC: 4.78 Mil/uL (ref 3.87–5.11)
RDW: 13.1 % (ref 11.5–15.5)
WBC: 7.9 10*3/uL (ref 4.0–10.5)

## 2022-03-21 LAB — COMPREHENSIVE METABOLIC PANEL
ALT: 53 U/L — ABNORMAL HIGH (ref 0–35)
AST: 57 U/L — ABNORMAL HIGH (ref 0–37)
Albumin: 4.2 g/dL (ref 3.5–5.2)
Alkaline Phosphatase: 86 U/L (ref 39–117)
BUN: 13 mg/dL (ref 6–23)
CO2: 29 mEq/L (ref 19–32)
Calcium: 9.1 mg/dL (ref 8.4–10.5)
Chloride: 102 mEq/L (ref 96–112)
Creatinine, Ser: 0.69 mg/dL (ref 0.40–1.20)
GFR: 100.36 mL/min (ref >60.00)
Glucose, Bld: 104 mg/dL — ABNORMAL HIGH (ref 70–99)
Potassium: 4.3 mEq/L (ref 3.5–5.1)
Sodium: 139 mEq/L (ref 135–145)
Total Bilirubin: 1.3 mg/dL — ABNORMAL HIGH (ref 0.2–1.2)
Total Protein: 7.3 g/dL (ref 6.0–8.3)

## 2022-03-21 LAB — VITAMIN B12: Vitamin B-12: 223 pg/mL (ref 211–911)

## 2022-03-21 LAB — LIPID PANEL
Cholesterol: 215 mg/dL — ABNORMAL HIGH (ref 0–200)
HDL: 55 mg/dL (ref >39.00)
LDL Cholesterol: 133 mg/dL — ABNORMAL HIGH (ref 0–99)
NonHDL: 159.7
Total CHOL/HDL Ratio: 4
Triglycerides: 136 mg/dL (ref 0.0–149.0)
VLDL: 27.2 mg/dL (ref 0.0–40.0)

## 2022-03-21 LAB — TSH: TSH: 0.83 u[IU]/mL (ref 0.35–5.50)

## 2022-03-21 LAB — VITAMIN D 25 HYDROXY (VIT D DEFICIENCY, FRACTURES): VITD: 17.76 ng/mL — ABNORMAL LOW (ref 30.00–100.00)

## 2022-03-21 MED ORDER — LISINOPRIL 10 MG PO TABS: ORAL_TABLET | ORAL | 3 refills | Status: DC

## 2022-03-21 NOTE — Patient Instructions (Addendum)
Please call the location of your choice from the menu below to schedule your Mammogram and/or Bone Density appointment.   ? ? ?[facility] ? ?[place] [facility]   ?Phone:  [phone]   ?[place], [place] [address]                                            ?Services: 3D Mammogram and Bone Density ? ? ? ?Consider shingles vaccine ? ?#Referral ?I have placed a referral to a specialist for you. You should receive a phone call from the specialty office. Make sure your voicemail is not full and that if you are able to answer your phone to unknown or new numbers.  ? ?It may take up to 2 weeks to hear about the referral. If you do not hear anything in 2 weeks, please call our office and ask to speak with the referral coordinator.  ? ?- GI  ?- healthy weight and wellness ?

## 2022-03-21 NOTE — Progress Notes (Signed)
Annual Exam  ? ?Chief Complaint:  ?Chief Complaint  ?Patient presents with  ? Annual Exam  ? ? ?History of Present Illness:  ?Ms. Ruth Pennington is a 51 y.o. No obstetric history on file. who LMP was No LMP recorded. Patient has had a hysterectomy., presents today for her annual examination.   ? ?#fatigue ?- no snoring or apnea ?- nocturia ?- sleeping in recliner ?- pressure from that ? ? ?Nutrition ?She does get adequate calcium and Vitamin D in her diet. ?Diet: could be better ?Exercise: lots of aches and pains ? ? ? ?Social History  ? ?Tobacco Use  ?Smoking Status Never  ?Smokeless Tobacco Never  ? ?Social History  ? ?Substance and Sexual Activity  ?Alcohol Use Yes  ? Comment: occassionally - 3 times a year  ? ?Social History  ? ?Substance and Sexual Activity  ?Drug Use Never  ? ? ? ?General Health ?Dentist in the last year: Yes ?Eye doctor: no ? ?Safety ?The patient wears seatbelts: yes.     ?The patient feels safe at home and in their relationships: yes. ? ? ?Menstrual:  ?Symptoms of menopause: hot flashes ?Surgical menopause ? ? ?GYN ?She is single partner, contraception - status post hysterectomy.  ? ? ?Cervical Cancer Screening (21-65):   ?Last Pap:  n/a ? ?Breast Cancer Screening (Age 67-74):  ?There is no FH of breast cancer. There is no FH of ovarian cancer. BRCA screening Not Indicated.  ?Last Mammogram: never ?The patient does want a mammogram this year.  ? ? ?Colon Cancer Screening:  ?Age 15-75 yo - benefits outweigh the risk. Adults 57-85 yo who have never been screened benefit.  ?Benefits: 134000 people in 2016 will be diagnosed and 49,000 will die - early detection helps ?[place]: Complications 2/2 to colonoscopy ?High Risk (Colonoscopy): genetic disorder (Lynch syndrome or familial adenomatous polyposis), personal hx of IBD, previous adenomatous polyp, or previous colorectal cancer, FamHx start 10 years before the age at diagnosis, increased in males and black race ? ?Options:  ?FIT - looks for  hemoglobin (blood in the stool) - specific and fairly sensitive - must be done annually ?Cologuard - looks for DNA and blood - more sensitive - therefore can have more false positives, every 3 years ?Colonoscopy - every 10 years if normal - sedation, bowl prep, must have someone drive you ? ?Shared decision making and the patient had decided to do colonoscopy. ? ? ?Social History  ? ?Tobacco Use  ?Smoking Status Never  ?Smokeless Tobacco Never  ? ? ?Lung Cancer Screening (Ages 27-51): not applicable ? ? ?Weight ?Wt Readings from Last 3 Encounters:  ?03/21/22 (!) 349 lb (158.3 kg)  ?06/25/21 (!) 336 lb (152.4 kg)  ?06/10/21 (!) 340 lb (154.2 kg)  ? ?Patient has high BMI  ?BMI Readings from Last 1 Encounters:  ?03/21/22 56.76 kg/m?  ? ? ? ?Chronic disease screening ?Blood pressure monitoring:  ?BP Readings from Last 3 Encounters:  ?03/21/22 130/80  ?01/11/22 (!) 143/90  ?06/25/21 128/64  ? ? ?Lipid Monitoring: Indication for screening: age >54, obesity, diabetes, family hx, CV risk factors.  ?Lipid screening: Yes ? ?Lab Results  ?Component Value Date  ? CHOL 201 (H) 03/22/2021  ? HDL 50.40 03/22/2021  ? LDLCALC 124 (H) 03/22/2021  ? TRIG 131.0 03/22/2021  ? CHOLHDL 4 03/22/2021  ? ? ? ?Diabetes Screening: age >29, overweight, family hx, PCOS, hx of gestational diabetes, at risk ethnicity ?Diabetes Screening screening: Yes ? ?Lab Results  ?Component Value  Date  ? HGBA1C 5.2 03/22/2021  ? ? ? ?Past Medical History:  ?Diagnosis Date  ? Endometrial cancer ([place])   ? Endometrial cancer ([place])   ? Hypertension   ? Hypokalemia   ? Obesity, morbid ([place]) 01/09/2015  ? ? ?Past Surgical History:  ?Procedure Laterality Date  ? ABDOMINAL HYSTERECTOMY    ? cervix removed   ? APPENDECTOMY    ? CHOLECYSTECTOMY    ? LAPAROSCOPIC APPENDECTOMY N/A 03/06/2018  ? Procedure: APPENDECTOMY LAPAROSCOPIC;  Surgeon: Jeanie Cooks, MD;  Location: [facility] ORS;  Service: General;  Laterality: N/A;  ? OOPHORECTOMY Bilateral 04/01/2018  ? ? ?Prior to  Admission medications   ?Medication Sig Start Date End Date Taking? Authorizing Provider  ?acetaminophen (TYLENOL) 500 MG tablet Take by mouth every 6 (six) hours as needed.  04/04/18  Yes [provider]  ?cyclobenzaprine (FLEXERIL) 10 MG tablet Take 1 tablet (10 mg total) by mouth 2 (two) times daily as needed for muscle spasms. 01/11/22  Yes Scot Jun, FNP  ?ibuprofen (ADVIL,MOTRIN) 200 MG tablet Take by mouth every 6 (six) hours as needed.    Yes [provider]  ?lisinopril (ZESTRIL) 10 MG tablet TAKE 1 TABLET(10 MG) BY MOUTH DAILY 03/22/21  Yes Lesleigh Noe, MD  ?meclizine (ANTIVERT) 25 MG tablet Take 1 tablet (25 mg total) by mouth 3 (three) times daily as needed for dizziness. 06/10/21  Yes Lavonia Drafts, MD  ? ? ?No Known Allergies ? ?Gynecologic History: No LMP recorded. Patient has had a hysterectomy. ? ?Obstetric History: No obstetric history on file. ? ?Social History  ? ?Socioeconomic History  ? Marital status: Married  ?  Spouse name: Karlton Lemon  ? Number of children: 5  ? Years of education: college  ? Highest education level: Not on file  ?Occupational History  ? Not on file  ?Tobacco Use  ? Smoking status: Never  ? Smokeless tobacco: Never  ?Substance and Sexual Activity  ? Alcohol use: Yes  ?  Comment: occassionally - 3 times a year  ? Drug use: Never  ? Sexual activity: Yes  ?  Birth control/protection: Surgical  ?Other Topics Concern  ? Not on file  ?Social History Narrative  ? 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 Has 5 children - 3 at home, 2 in college  ? Enjoys: planning cruises, playing candy crush  ? Exercise: hoping to get back in walking regularly  ? Diet: avoids carbs  ? ?Social Determinants of Health  ? ?Financial Resource Strain: Not on file  ?Food Insecurity: Not on file  ?Transportation Needs: Not on file  ?Physical Activity: Not on file  ?Stress: Not on file  ?Social Connections: Not on file  ?Intimate Partner Violence: Not on  file  ? ? ?Family History  ?Problem Relation Age of Onset  ? Hypertension Mother   ? Aortic stenosis Mother   ? Other Mother   ?     Trigeminal neuralgia  ? Glaucoma Father   ? Hypertension Father   ? Diabetes Father   ? Prostate cancer Father   ? Prostate cancer Brother   ? ? ?Review of Systems  ?Constitutional:  Negative for chills and fever.  ?HENT:  Negative for congestion and sore throat.   ?Eyes:  Negative for blurred vision and double vision.  ?Respiratory:  Negative for shortness of breath.   ?Cardiovascular:  Negative for chest pain.  ?Gastrointestinal:  Negative for heartburn, nausea and vomiting.  ?Genitourinary:  Negative.   ?Musculoskeletal: Negative.  Negative for myalgias.  ?Skin:  Negative for rash.  ?Neurological:  Negative for dizziness and headaches.  ?Endo/Heme/Allergies:  Does not bruise/bleed easily.  ?Psychiatric/Behavioral:  Negative for depression. The patient is not nervous/anxious.    ? ?Physical Exam ?BP 130/80   Pulse (!) 101   Temp 98.1 ?F (36.7 ?C) (Oral)   Ht 5' 5.75" (1.67 m)   Wt (!) 349 lb (158.3 kg)   SpO2 95%   BMI 56.76 kg/m?   ? ?BP Readings from Last 3 Encounters:  ?03/21/22 130/80  ?01/11/22 (!) 143/90  ?06/25/21 128/64  ? ? ? ? ?Physical Exam ?Constitutional:   ?   General: She is not in acute distress. ?   Appearance: She is well-developed. She is obese. She is not diaphoretic.  ?HENT:  ?   Head: Normocephalic and atraumatic.  ?   Right Ear: External ear normal.  ?   Left Ear: External ear normal.  ?   Nose: Nose normal.  ?Eyes:  ?   General: No scleral icterus. ?   Extraocular Movements: Extraocular movements intact.  ?   Conjunctiva/sclera: Conjunctivae normal.  ?Cardiovascular:  ?   Rate and Rhythm: Normal rate and regular rhythm.  ?   Heart sounds: No murmur heard. ?Pulmonary:  ?   Effort: Pulmonary effort is normal. No respiratory distress.  ?   Breath sounds: Normal breath sounds. No wheezing.  ?Abdominal:  ?   General: Bowel sounds are normal. There is no  distension.  ?   Palpations: Abdomen is soft. There is no mass.  ?   Tenderness: There is no abdominal tenderness. There is no guarding or rebound.  ?Musculoskeletal:     ?   General: Normal range of motion.  ?   [facility]

## 2022-03-22 ENCOUNTER — Other Ambulatory Visit: Payer: Self-pay | Admitting: Family Medicine

## 2022-03-22 ENCOUNTER — Encounter: Payer: Self-pay | Admitting: Family Medicine

## 2022-03-22 DIAGNOSIS — E559 Vitamin D deficiency, unspecified: Secondary | ICD-10-CM

## 2022-03-22 DIAGNOSIS — R739 Hyperglycemia, unspecified: Secondary | ICD-10-CM

## 2022-03-22 MED ORDER — VITAMIN D (ERGOCALCIFEROL) 1.25 MG (50000 UNIT) PO CAPS: 50000.0000 [IU] | ORAL_CAPSULE | ORAL | 1 refills | Status: DC

## 2022-03-26 ENCOUNTER — Telehealth: Payer: Self-pay

## 2022-03-26 NOTE — Telephone Encounter (Signed)
Letter sent out to contact patient  ?

## 2022-04-16 MED ORDER — WEGOVY 0.25 MG/0.5ML ~~LOC~~ SOAJ: 0.2500 mg | SUBCUTANEOUS | 0 refills | Status: DC

## 2022-04-16 NOTE — Addendum Note (Signed)
Addended by: Waunita Schooner R on: 04/16/2022 12:10 PM   Modules accepted: Orders

## 2022-04-16 NOTE — Telephone Encounter (Signed)
Placed order for wegovy

## 2022-04-18 ENCOUNTER — Telehealth: Payer: Self-pay

## 2022-04-18 NOTE — Telephone Encounter (Signed)
Prior auth started for [facility] 0.'25MG'$ /0.5ML auto-injectors. Alfonzo Beers ([employer]) Rx #: [id number] Waiting for determination.

## 2022-04-18 NOTE — Telephone Encounter (Signed)
Canceled prior auth request for [facility] per [place], due to discontinuation of this particular treatment.

## 2022-04-19 NOTE — Telephone Encounter (Signed)
Started new prior auth per [place] for [employer] 0.'25MG'$ /0.5ML auto-injectors. Alfonzo Beers ([employer]) Waiting for determination.

## 2022-04-24 NOTE — Telephone Encounter (Signed)
Sent mychart note to patient to have her send a picture of the back of her insurance card.  When I receive that, I will call to see if I can get a verbal authorization from insurance for [facility].

## 2022-05-12 ENCOUNTER — Other Ambulatory Visit: Payer: Self-pay | Admitting: Family Medicine

## 2022-05-12 DIAGNOSIS — E559 Vitamin D deficiency, unspecified: Secondary | ICD-10-CM

## 2022-06-03 NOTE — Telephone Encounter (Signed)
Ruth Pennington,  This has been sitting in [name]'s in box for over a month.  Is this something you are taking care of or was [name] suppost to be doing something with this?

## 2022-06-03 NOTE — Telephone Encounter (Signed)
Prior auth for Wegovy 0.25 MG/0.5 ML PEN has been denied on RX Benefits. Per RX Benefits:  The requested product is a plan-design exclusion and is not covered at this time.

## 2022-06-03 NOTE — Telephone Encounter (Signed)
I called RX Benefits to see if a verbal PA can be done.  Per Luvenia Starch at [facility], verbal prior [facility] are not possible.  I went on [email or web address] to complete an onine PA.  Prior Auth (EOC) ID: [id number] Drug/Service Name: WEGOVY 0.25 MG/0.5 ML PEN Waiting for determination.

## 2022-06-04 NOTE — Telephone Encounter (Signed)
Noted.  Will defer to PCP

## 2022-06-06 ENCOUNTER — Encounter: Payer: Self-pay | Admitting: *Deleted

## 2022-07-23 NOTE — Telephone Encounter (Signed)
FYI Referral was closed. They tried to reach the patient several times since July and was never able to schedule an appt.

## 2023-02-26 ENCOUNTER — Encounter: Payer: Self-pay | Admitting: Nurse Practitioner

## 2023-02-26 ENCOUNTER — Ambulatory Visit: Payer: BC Managed Care – PPO | Admitting: Nurse Practitioner

## 2023-02-26 DIAGNOSIS — Z6841 Body Mass Index (BMI) 40.0 and over, adult: Secondary | ICD-10-CM

## 2023-02-26 DIAGNOSIS — R4589 Other symptoms and signs involving emotional state: Secondary | ICD-10-CM | POA: Diagnosis not present

## 2023-02-26 DIAGNOSIS — I10 Essential (primary) hypertension: Secondary | ICD-10-CM | POA: Diagnosis not present

## 2023-02-26 MED ORDER — LISINOPRIL 10 MG PO TABS: ORAL_TABLET | ORAL | 3 refills | Status: DC

## 2023-02-26 MED ORDER — TIRZEPATIDE-WEIGHT MANAGEMENT 2.5 MG/0.5ML ~~LOC~~ SOAJ: 2.5000 mg | SUBCUTANEOUS | 0 refills | Status: DC

## 2023-02-26 MED ORDER — TIRZEPATIDE-WEIGHT MANAGEMENT 5 MG/0.5ML ~~LOC~~ SOAJ: 5.0000 mg | SUBCUTANEOUS | 0 refills | Status: DC

## 2023-02-26 NOTE — Assessment & Plan Note (Signed)
Chronic. Stable on Lisinopril 10 mg daily. Continue. Refills sent.

## 2023-02-26 NOTE — Assessment & Plan Note (Addendum)
Long discussion with patient regarding diet, exercise and importance of lifestyle modifications even with medications like Zepbound and Wegovy. Will start the patient on Zepbound. Counseled on the risk of pancreatitis and gallbladder disease. Discussed the risk of nausea. Advised to discontinue the Zepbound and contact us immediately if they develop abdominal pain. If they develop excessive nausea they will contact us right away. I discussed that medullary thyroid cancer has been seen in rats studies. The patient confirmed no personal or family history of thyroid cancer, parathyroid cancer, or adrenal gland cancer. Discussed that we thus far have not seen medullary thyroid cancer result from use of this type of medication in humans. They were advised to monitor the thyroid area and contact us for any lumps, swelling, trouble swallowing, or any other changes in this area. Discussed goal weight loss of 1 to 2 pounds a week while on this medication. Will monitor.

## 2023-02-26 NOTE — Progress Notes (Signed)
Ruth Morrow, NP-C Phone: [phone]  Ruth Pennington is a 52 y.o. female who presents today for transfer of care. She would like to discuss her weight. She is requesting a prescription for Wegovy or Zepbound. She has tried to get it in the past but her insurance did not cover it at the time. She reports they do cover it now and she would like to try and get it. She was previously referred to [facility]. She did not see them as it was too expensive. She has attempted weight loss multiple times with diet changes.   HYPERTENSION Disease Monitoring Home BP Monitoring- Not checking Chest pain- No    Dyspnea- No Medications Compliance-  Lisinopril. Lightheadedness-  No  Edema- No BMET    Component Value Date/Time   NA 139 03/21/2022 1103   K 4.3 03/21/2022 1103   CL 102 03/21/2022 1103   CO2 29 03/21/2022 1103   GLUCOSE 104 (H) 03/21/2022 1103   BUN 13 03/21/2022 1103   CREATININE 0.69 03/21/2022 1103   CALCIUM 9.1 03/21/2022 1103   GFRNONAA >60 06/10/2021 1007   GFRAA >60 01/07/2020 2201    Social History   Tobacco Use  Smoking Status Never  Smokeless Tobacco Never    Current Outpatient Medications on File Prior to Visit  Medication Sig Dispense Refill   acetaminophen (TYLENOL) 500 MG tablet Take by mouth every 6 (six) hours as needed.      ibuprofen (ADVIL,MOTRIN) 200 MG tablet Take by mouth every 6 (six) hours as needed.      No current facility-administered medications on file prior to visit.    ROS see history of present illness  Objective  Physical Exam Vitals:   02/26/23 1036  BP: 124/80  Pulse: 96  Temp: 99.8 F (37.7 C)  SpO2: 96%    BP Readings from Last 3 Encounters:  02/26/23 124/80  03/21/22 130/80  01/11/22 (!) 143/90   Wt Readings from Last 3 Encounters:  02/26/23 (!) 348 lb 9.6 oz (158.1 kg)  03/21/22 (!) 349 lb (158.3 kg)  06/25/21 (!) 336 lb (152.4 kg)    Physical Exam Constitutional:      General: She is not in acute  distress.    Appearance: Normal appearance. She is obese.  HENT:     Head: Normocephalic.  Cardiovascular:     Rate and Rhythm: Normal rate and regular rhythm.     Heart sounds: Normal heart sounds.  Pulmonary:     Effort: Pulmonary effort is normal.     Breath sounds: Normal breath sounds.  Skin:    General: Skin is warm and dry.  Neurological:     General: No focal deficit present.     Mental Status: She is alert.  Psychiatric:        Mood and Affect: Mood normal.        Behavior: Behavior normal.    Assessment/Plan: Please see individual problem list.  Morbid obesity with BMI of 50.0-59.9, adult Assessment & Plan: Long discussion with patient regarding diet, exercise and importance of lifestyle modifications even with medications like Zepbound and Wegovy. Will start the patient on Zepbound. Counseled on the risk of pancreatitis and gallbladder disease. Discussed the risk of nausea. Advised to discontinue the Zepbound and contact us immediately if they develop abdominal pain. If they develop excessive nausea they will contact us right away. I discussed that medullary thyroid cancer has been seen in rats studies. The patient confirmed no personal or family history  of thyroid cancer, parathyroid cancer, or adrenal gland cancer. Discussed that we thus far have not seen medullary thyroid cancer result from use of this type of medication in humans. They were advised to monitor the thyroid area and contact us for any lumps, swelling, trouble swallowing, or any other changes in this area. Discussed goal weight loss of 1 to 2 pounds a week while on this medication. Will monitor.   Orders: -     Tirzepatide-Weight Management; Inject 2.5 mg into the skin once a week.  Dispense: 2 mL; Refill: 0 -     Tirzepatide-Weight Management; Inject 5 mg into the skin once a week.  Dispense: 2 mL; Refill: 0  Essential hypertension Assessment & Plan: Chronic. Stable on Lisinopril 10 mg daily. Continue.  Refills sent.   Orders: -     Lisinopril; TAKE 1 TABLET(10 MG) BY MOUTH DAILY  Dispense: 90 tablet; Refill: 3  Depressed mood Assessment & Plan: PHQ- 11 today. Believes some of her depression is due to her weight and not being able to move around/do as much as she would like. She is not interested in medication at this time. She does have access through her insurance to a mental health tele-doc that she is going to look into. Denies SI/HI. Encouraged her to contact if symptoms are changing or worsening. Will continue to monitor.     Return in 26 days (on 03/24/2023) for Annual Exam.   Ruth Morrow, NP-C [place]

## 2023-02-26 NOTE — Assessment & Plan Note (Signed)
PHQ- 11 today. Believes some of her depression is due to her weight and not being able to move around/do as much as she would like. She is not interested in medication at this time. She does have access through her insurance to a mental health tele-doc that she is going to look into. Denies SI/HI. Encouraged her to contact if symptoms are changing or worsening. Will continue to monitor.

## 2023-02-27 ENCOUNTER — Encounter: Payer: Self-pay | Admitting: Nurse Practitioner

## 2023-03-04 ENCOUNTER — Telehealth: Payer: Self-pay

## 2023-03-04 ENCOUNTER — Other Ambulatory Visit (HOSPITAL_COMMUNITY): Payer: Self-pay

## 2023-03-04 NOTE — Telephone Encounter (Signed)
Pharmacy Patient Advocate Encounter   Received notification that prior authorization for ZEPBOUND 2.5 MG/0.5 ML PEN is required/requested.  Per Test Claim: PA required   PA submitted on 03/04/23 to (ins) [employer] via [email or web address] Prior Auth (EOC) ID: [id number] Status is pending

## 2023-03-05 NOTE — Telephone Encounter (Signed)
Pharmacy Patient Advocate Encounter  Received notification from [employer] that the request for prior authorization for ZEPBOUND 2.5 MG/0.5 ML PEN  has been denied due to plan exclusion.      If you have questions or concerns, please contact the [employer] Prior Authorization Services Department at [phone]  Denial letter attached to charts

## 2023-03-14 ENCOUNTER — Encounter: Payer: BC Managed Care – PPO | Admitting: Nurse Practitioner

## 2023-03-24 ENCOUNTER — Ambulatory Visit (INDEPENDENT_AMBULATORY_CARE_PROVIDER_SITE_OTHER): Payer: BC Managed Care – PPO | Admitting: Nurse Practitioner

## 2023-03-24 ENCOUNTER — Encounter: Payer: Self-pay | Admitting: Nurse Practitioner

## 2023-03-24 VITALS — BP 124/70 | HR 101 | Temp 99.4°F | Ht 65.75 in | Wt 351.0 lb

## 2023-03-24 DIAGNOSIS — E559 Vitamin D deficiency, unspecified: Secondary | ICD-10-CM | POA: Diagnosis not present

## 2023-03-24 DIAGNOSIS — I10 Essential (primary) hypertension: Secondary | ICD-10-CM | POA: Diagnosis not present

## 2023-03-24 DIAGNOSIS — Z0001 Encounter for general adult medical examination with abnormal findings: Secondary | ICD-10-CM

## 2023-03-24 DIAGNOSIS — L219 Seborrheic dermatitis, unspecified: Secondary | ICD-10-CM | POA: Diagnosis not present

## 2023-03-24 DIAGNOSIS — Z0000 Encounter for general adult medical examination without abnormal findings: Secondary | ICD-10-CM | POA: Diagnosis not present

## 2023-03-24 DIAGNOSIS — Z1329 Encounter for screening for other suspected endocrine disorder: Secondary | ICD-10-CM

## 2023-03-24 DIAGNOSIS — Z13220 Encounter for screening for lipoid disorders: Secondary | ICD-10-CM

## 2023-03-24 DIAGNOSIS — Z6841 Body Mass Index (BMI) 40.0 and over, adult: Secondary | ICD-10-CM | POA: Diagnosis not present

## 2023-03-24 DIAGNOSIS — Z1211 Encounter for screening for malignant neoplasm of colon: Secondary | ICD-10-CM

## 2023-03-24 DIAGNOSIS — Z1231 Encounter for screening mammogram for malignant neoplasm of breast: Secondary | ICD-10-CM

## 2023-03-24 MED ORDER — KETOCONAZOLE 2 % EX SHAM
1.0000 | MEDICATED_SHAMPOO | CUTANEOUS | 2 refills | Status: DC
Start: 2023-03-24 — End: 2024-03-03

## 2023-03-24 NOTE — Assessment & Plan Note (Signed)
Chronic. Stable on Lisinopril 10 mg daily. Continue. Lab work as outlined.  

## 2023-03-24 NOTE — Assessment & Plan Note (Signed)
Chronic. Taking OTC Vitamin D3 gummies daily. Continue. Will check Vitamin D level today.

## 2023-03-24 NOTE — Assessment & Plan Note (Signed)
Patient unable to get Zepbound due to insurance. She is working hard to change her diet and increase her exercise. Discussed healthy diet options and advised decreasing sweets and soda intake. Will check A1c today. Will continue to monitor.

## 2023-03-24 NOTE — Assessment & Plan Note (Signed)
Physical exam complete. Lab work as outlined. Will contact patient with results. Pap- Hysterectomy, no longer indicated. Colonoscopy- due, will refer to GI. Mammogram- due, order placed. Encouraged patient to call to schedule. Flu and tetanus vaccines- UTD. Declined additional COVID vaccines. She is interested in the shingles vaccine, information provided. Encouraged her to get, she can return for this or get at her local pharmacy. Declined HIV screening. Deferred Hep C screening. Recommended establishing with Dentist and follow up with Ophthalmology for annual exams. Encouraged to continue working on healthy diet and increasing exercise. Return to care in 6 months, sooner PRN.

## 2023-03-24 NOTE — Assessment & Plan Note (Addendum)
Noted during exam. Patient reports trying multiple different OTC shampoos without relief. Will trial Ketoconazole twice weekly. Encouraged to contact if not improving or worsening for referral to Dermatology. Will continue to monitor.

## 2023-03-24 NOTE — Progress Notes (Signed)
Ruth Dicker, NP-C Phone: [phone]  Ruth Pennington is a 52 y.o. female who presents today for annual exam. She has no complaints or new concerns today. She is doing well on all of her medications.   Diet: Working on it- eating more salads, increased protein and vegetables. Snacking on healthier options such as cottage cheese, likes sweets and soda. Exercise: None- trying to move more, works at home- sedentary Pap smear: Hx Hysterectomy Colonoscopy: Never- Due! Mammogram: Never- Due! Family history-  Colon cancer: No  Breast cancer: No  Ovarian cancer: No Menses: Hysterectomy Sexually active: Yes Vaccines-   Flu: Not due  Tetanus: 11/24/2018  Shingles: Interested  COVID19: x 3 HIV screening: Declined Hep C Screening: Deferred Tobacco use: No Alcohol use: Rarely- on special occasions Illicit Drug use: No Dentist: No Ophthalmology: Yes   Social History   Tobacco Use  Smoking Status Never  Smokeless Tobacco Never    Current Outpatient Medications on File Prior to Visit  Medication Sig Dispense Refill   acetaminophen (TYLENOL) 500 MG tablet Take by mouth every 6 (six) hours as needed.      ibuprofen (ADVIL,MOTRIN) 200 MG tablet Take by mouth every 6 (six) hours as needed.      lisinopril (ZESTRIL) 10 MG tablet TAKE 1 TABLET(10 MG) BY MOUTH DAILY 90 tablet 3   tirzepatide (ZEPBOUND) 2.5 MG/0.5ML Pen Inject 2.5 mg into the skin once a week. (Patient not taking: Reported on 03/24/2023) 2 mL 0   tirzepatide (ZEPBOUND) 5 MG/0.5ML Pen Inject 5 mg into the skin once a week. (Patient not taking: Reported on 03/24/2023) 2 mL 0   No current facility-administered medications on file prior to visit.     ROS see history of present illness  Objective  Physical Exam Vitals:   03/24/23 1504  BP: 124/70  Pulse: (!) 101  Temp: 99.4 F (37.4 C)  SpO2: 97%    BP Readings from Last 3 Encounters:  03/24/23 124/70  02/26/23 124/80  03/21/22 130/80   Wt Readings from Last  3 Encounters:  03/24/23 (!) 351 lb (159.2 kg)  02/26/23 (!) 348 lb 9.6 oz (158.1 kg)  03/21/22 (!) 349 lb (158.3 kg)    Physical Exam Constitutional:      General: She is not in acute distress.    Appearance: Normal appearance. She is obese.  HENT:     Head: Normocephalic.     Right Ear: Tympanic membrane normal.     Left Ear: Tympanic membrane normal.     Nose: Nose normal.     Mouth/Throat:     Mouth: Mucous membranes are moist.     Pharynx: Oropharynx is clear.  Eyes:     Conjunctiva/sclera: Conjunctivae normal.     Pupils: Pupils are equal, round, and reactive to light.  Neck:     Thyroid: No thyromegaly.  Cardiovascular:     Rate and Rhythm: Normal rate and regular rhythm.     Heart sounds: Normal heart sounds.  Pulmonary:     Effort: Pulmonary effort is normal.     Breath sounds: Normal breath sounds.  Abdominal:     General: Abdomen is flat. Bowel sounds are normal.     Palpations: Abdomen is soft. There is no mass.     Tenderness: There is no abdominal tenderness.  Musculoskeletal:        General: Normal range of motion.  Lymphadenopathy:     Cervical: No cervical adenopathy.  Skin:    General: Skin is warm  and dry.     Findings: Rash present. Rash is crusting and scaling.     Comments: Right side scalp- covering large area. Erythema, inflammation present.   Neurological:     General: No focal deficit present.     Mental Status: She is alert.  Psychiatric:        Mood and Affect: Mood normal.        Behavior: Behavior normal.    Assessment/Plan: Please see individual problem list.  Encounter for routine adult medical exam with abnormal findings Assessment & Plan: Physical exam complete. Lab work as outlined. Will contact patient with results. Pap- Hysterectomy, no longer indicated. Colonoscopy- due, will refer to GI. Mammogram- due, order placed. Encouraged patient to call to schedule. Flu and tetanus vaccines- UTD. Declined additional COVID vaccines. She  is interested in the shingles vaccine, information provided. Encouraged her to get, she can return for this or get at her local pharmacy. Declined HIV screening. Deferred Hep C screening. Recommended establishing with Dentist and follow up with Ophthalmology for annual exams. Encouraged to continue working on healthy diet and increasing exercise. Return to care in 6 months, sooner PRN.   Orders: -     CBC with Differential/Platelet -     Comprehensive metabolic panel -     Lipid panel -     TSH -     Hemoglobin A1c  Seborrheic dermatitis of scalp Assessment & Plan: Noted during exam. Patient reports trying multiple different OTC shampoos without relief. Will trial Ketoconazole twice weekly. Encouraged to contact if not improving or worsening for referral to Dermatology. Will continue to monitor.   Orders: -     Ketoconazole; Apply 1 Application topically 2 (two) times a week.  Dispense: 120 mL; Refill: 2  Essential hypertension Assessment & Plan: Chronic. Stable on Lisinopril 10 mg daily. Continue. Lab work as outlined.   Orders: -     CBC with Differential/Platelet -     Comprehensive metabolic panel  Morbid obesity with BMI of 50.0-59.9, adult [facility]) Assessment & Plan: Patient unable to get Zepbound due to insurance. She is working hard to change her diet and increase her exercise. Discussed healthy diet options and advised decreasing sweets and soda intake. Will check A1c today. Will continue to monitor.   Orders: -     Hemoglobin A1c  Vitamin D deficiency Assessment & Plan: Chronic. Taking OTC Vitamin D3 gummies daily. Continue. Will check Vitamin D level today.   Orders: -     VITAMIN D 25 Hydroxy (Vit-D Deficiency, Fractures)  Lipid screening -     Lipid panel  Thyroid disorder screen -     TSH  Screen for colon cancer -     Ambulatory referral to Gastroenterology  Screening mammogram for breast cancer -     3D Screening Mammogram, Left and Right;  Future    Return in about 6 months (around 09/23/2023) for Follow up.   Ruth Dicker, NP-C [facility] [employer]

## 2023-03-25 LAB — CBC WITH DIFFERENTIAL/PLATELET
Basophils Absolute: 0.1 10*3/uL (ref 0.0–0.1)
Basophils Relative: 1 % (ref 0.0–3.0)
Eosinophils Absolute: 0.1 10*3/uL (ref 0.0–0.7)
Eosinophils Relative: 1.1 % (ref 0.0–5.0)
HCT: 41.5 % (ref 36.0–46.0)
Hemoglobin: 14.3 g/dL (ref 12.0–15.0)
Lymphocytes Relative: 28.6 % (ref 12.0–46.0)
Lymphs Abs: 2.5 10*3/uL (ref 0.7–4.0)
MCHC: 34.4 g/dL (ref 30.0–36.0)
MCV: 93.7 fl (ref 78.0–100.0)
Monocytes Absolute: 0.7 10*3/uL (ref 0.1–1.0)
Monocytes Relative: 8.5 % (ref 3.0–12.0)
Neutro Abs: 5.3 10*3/uL (ref 1.4–7.7)
Neutrophils Relative %: 60.8 % (ref 43.0–77.0)
Platelets: 240 10*3/uL (ref 150.0–400.0)
RBC: 4.43 Mil/uL (ref 3.87–5.11)
RDW: 12.9 % (ref 11.5–15.5)
WBC: 8.8 10*3/uL (ref 4.0–10.5)

## 2023-03-25 LAB — LIPID PANEL
Cholesterol: 177 mg/dL (ref 0–200)
HDL: 48 mg/dL (ref >39.00)
LDL Cholesterol: 97 mg/dL (ref 0–99)
NonHDL: 129.01
Total CHOL/HDL Ratio: 4
Triglycerides: 161 mg/dL — ABNORMAL HIGH (ref 0.0–149.0)
VLDL: 32.2 mg/dL (ref 0.0–40.0)

## 2023-03-25 LAB — COMPREHENSIVE METABOLIC PANEL
ALT: 43 U/L — ABNORMAL HIGH (ref 0–35)
AST: 36 U/L (ref 0–37)
Albumin: 3.8 g/dL (ref 3.5–5.2)
Alkaline Phosphatase: 79 U/L (ref 39–117)
BUN: 9 mg/dL (ref 6–23)
CO2: 28 mEq/L (ref 19–32)
Calcium: 9 mg/dL (ref 8.4–10.5)
Chloride: 103 mEq/L (ref 96–112)
Creatinine, Ser: 0.64 mg/dL (ref 0.40–1.20)
GFR: 101.47 mL/min (ref >60.00)
Glucose, Bld: 102 mg/dL — ABNORMAL HIGH (ref 70–99)
Potassium: 3.8 mEq/L (ref 3.5–5.1)
Sodium: 138 mEq/L (ref 135–145)
Total Bilirubin: 0.7 mg/dL (ref 0.2–1.2)
Total Protein: 6.8 g/dL (ref 6.0–8.3)

## 2023-03-25 LAB — TSH: TSH: 0.57 u[IU]/mL (ref 0.35–5.50)

## 2023-03-25 LAB — HEMOGLOBIN A1C: Hgb A1c MFr Bld: 5.4 % (ref 4.6–6.5)

## 2023-03-25 LAB — VITAMIN D 25 HYDROXY (VIT D DEFICIENCY, FRACTURES): VITD: 38.27 ng/mL (ref 30.00–100.00)

## 2023-04-07 ENCOUNTER — Encounter: Payer: Self-pay | Admitting: *Deleted

## 2023-05-20 ENCOUNTER — Other Ambulatory Visit: Payer: Self-pay | Admitting: Nurse Practitioner

## 2023-05-20 DIAGNOSIS — L219 Seborrheic dermatitis, unspecified: Secondary | ICD-10-CM

## 2023-06-20 ENCOUNTER — Encounter: Payer: Self-pay | Admitting: Nurse Practitioner

## 2023-06-20 NOTE — Telephone Encounter (Signed)
Called mom and gave her the name and number to placed where referral was sent to call and schedule an appointment.

## 2023-08-06 ENCOUNTER — Encounter: Payer: Self-pay | Admitting: Nurse Practitioner

## 2023-08-06 ENCOUNTER — Telehealth: Payer: Self-pay

## 2023-08-06 NOTE — Telephone Encounter (Signed)
Pt needs a new PA for Zepbound a new insurance has been uploaded to media and via a [employer]. Pt would like to test the new insurance for coverage of the [facility]   Provider [employer]

## 2023-08-11 ENCOUNTER — Telehealth: Payer: Self-pay

## 2023-08-11 ENCOUNTER — Other Ambulatory Visit (HOSPITAL_COMMUNITY): Payer: Self-pay

## 2023-08-11 NOTE — Telephone Encounter (Signed)
PA request has been Submitted. New Encounter created for follow up. For additional info see Pharmacy Prior Auth telephone encounter from 08/11/2023.

## 2023-08-11 NOTE — Telephone Encounter (Signed)
Pharmacy Patient Advocate Encounter   Received notification from Pt Calls Messages that prior authorization for Zepbound is required/requested.   Insurance verification completed.   The patient is insured through [facility] .   Per test claim: PA required; PA submitted to [employer] via CoverMyMeds Key/confirmation #/EOC [facility] Status is pending

## 2023-08-12 NOTE — Telephone Encounter (Signed)
[employer] is needing more information as to why the patient is needing the medication  Is it  morbid obesity , other documentation showing that she has tried other obesity meds.

## 2023-08-13 ENCOUNTER — Encounter: Payer: Self-pay | Admitting: Nurse Practitioner

## 2023-08-13 NOTE — Telephone Encounter (Signed)
Pt called back wanting an update on the PA for the zepbound medication. I let the pt know that her insurance needed more information and we sent that off to our pharmacy team. Pt stated well how would they know the information needed if it is not going to my doctor. I let her know that the pharmacy team works with your doctor together to get you approved for the medication. Pt asked me if she could talk to NP [facility]. I talked to the cma and she stated she does not have an update yet and once she does she will give the pt a call. I told the pt that NP Konrad Dolores is with a patient and I relayed what the cma told me to the pt. The pt stated that's funny after I told her that NP Konrad Dolores was with a pt Pt also said something else that I could not make out over the phone

## 2023-08-14 ENCOUNTER — Other Ambulatory Visit (HOSPITAL_COMMUNITY): Payer: Self-pay

## 2023-08-14 NOTE — Telephone Encounter (Signed)
Contacted number on back of pt's [employer] card to attempt to retrieve denial reasons/documentation. According to rep, the pt's coverage ended before the date of the PA. Insurance representative recommend that pt call her insurance to verify that she 1. Has coverage, and 2. Has the most recent insurance card.

## 2023-08-14 NOTE — Telephone Encounter (Signed)
Pt called stating she want a copy of the PA paper that has been submitted. Pt stated she has been trying to get this medication since April. Pt has been doing all the requirements to get medication approved which was having a reduced calorie and exercise. Pt want additional information added to her chart by the provider. Pt stated she does not want to get lost in the shuffle and she does not want to die. Pt would like to be called regarding this message and the mychart message she sent

## 2023-08-14 NOTE — Telephone Encounter (Signed)
This is in regards to pts daughter MRN: [id number]  it has been documented that Ruth Pennington was advised about our grace period and the actual time that she showed up to the appt. Front staff stated daughter walked in alone after 3:10 but Ruth Pennington states it was before then.   It has also been documented of her animosity in a prior phone call as well. Provider does not want to call pt and would like someone to inform Ruth Pennington.

## 2023-08-15 ENCOUNTER — Other Ambulatory Visit (HOSPITAL_COMMUNITY): Payer: Self-pay

## 2023-08-15 NOTE — Telephone Encounter (Signed)
Spoke with NP and with patient DPR and mother and advised we would need to be on time but in future NP would be notified of any issue.DPR was advised because of parent relationship would have to be with minor for front to check in and before the10 minute period> I offered to reschedule patient but DPR advised would need to call back after checking school schedule.

## 2023-08-15 NOTE — Telephone Encounter (Signed)
While I hate that she's been struggling to obtain this medication, the PA was submitted to insurance provided, [facility], with the correct billing information. According to plan representative that I spoke with the other day, she received a medical denial, because she is not covered for pharmacy benefits under this plan. Rep wasn't even able to fax denial to me due to the PA not even being considered (because there's no pharmacy coverage), would love to do more, but this needs to be discussed between pt and plan, this is also not an urgent medication request as pt's life is not in immediate danger without this medication. Please be advised, pt can expect standard response times from PA team of 3-5 days.

## 2023-08-20 ENCOUNTER — Encounter: Payer: Self-pay | Admitting: Nurse Practitioner

## 2023-08-22 ENCOUNTER — Encounter: Payer: Self-pay | Admitting: *Deleted

## 2023-08-26 ENCOUNTER — Other Ambulatory Visit: Payer: Self-pay | Admitting: Nurse Practitioner

## 2023-08-26 NOTE — Telephone Encounter (Signed)
[employer] called stating to call them to do a Peer to Peer review for the patient's Zepbound @ [phone].

## 2023-09-04 ENCOUNTER — Telehealth: Payer: Self-pay

## 2023-09-04 NOTE — Telephone Encounter (Signed)
Approval letter has been received via mail for pts Zepbound. Copy has been mailed to pt one has been sent to be scanned in pts chart.

## 2023-09-05 ENCOUNTER — Encounter: Payer: Self-pay | Admitting: Nurse Practitioner

## 2023-09-05 ENCOUNTER — Other Ambulatory Visit: Payer: Self-pay

## 2023-09-05 ENCOUNTER — Telehealth: Payer: Self-pay

## 2023-09-05 MED ORDER — TIRZEPATIDE-WEIGHT MANAGEMENT 5 MG/0.5ML ~~LOC~~ SOAJ
5.0000 mg | SUBCUTANEOUS | 0 refills | Status: DC
Start: 2023-09-05 — End: 2023-10-30

## 2023-09-05 NOTE — Telephone Encounter (Signed)
Provider did a peer to peer review for Zepbound to be approved, provider wanted me to send to the PA team just in case they try and deny her for the next dose the 5 mg.   If she has been approved that would approve all doses correct?   Thanks in advance.

## 2023-09-09 ENCOUNTER — Other Ambulatory Visit (HOSPITAL_COMMUNITY): Payer: Self-pay

## 2023-09-09 NOTE — Telephone Encounter (Signed)
Unfortunately, it depends on the plan. Ruth Pennington is not allowing me to make a successful test claim, otherwise I would tell you, my apologies.

## 2023-09-12 ENCOUNTER — Other Ambulatory Visit (HOSPITAL_COMMUNITY): Payer: Self-pay

## 2023-09-18 ENCOUNTER — Telehealth: Payer: Self-pay | Admitting: Nurse Practitioner

## 2023-09-18 ENCOUNTER — Other Ambulatory Visit (HOSPITAL_COMMUNITY): Payer: Self-pay

## 2023-09-18 NOTE — Telephone Encounter (Signed)
Patient called and states the new medication  tirzepatide (ZEPBOUND) 5 MG/0.5ML Pen  Does not have a PA . Pharmacy is telling her a PA is needed.

## 2023-09-18 NOTE — Telephone Encounter (Signed)
After speaking with [employer] about 08-27-23 Peer to Peer review for Zepbound they informed me that a PA would not be needed for the higher doses of zepbound. I spoke w/pharmacy and they informed me the Rx went through that pt would have a high deductible that medication is $203 out of pocket for medication. Pt has been informed via mychart that Zepbound is gone through pharmacy.

## 2023-10-02 ENCOUNTER — Encounter: Payer: Self-pay | Admitting: Nurse Practitioner

## 2023-10-02 ENCOUNTER — Ambulatory Visit (INDEPENDENT_AMBULATORY_CARE_PROVIDER_SITE_OTHER): Payer: BC Managed Care – PPO | Admitting: Nurse Practitioner

## 2023-10-02 VITALS — BP 122/84 | HR 100 | Temp 98.6°F | Ht 65.75 in | Wt 335.0 lb

## 2023-10-02 DIAGNOSIS — I10 Essential (primary) hypertension: Secondary | ICD-10-CM | POA: Diagnosis not present

## 2023-10-02 DIAGNOSIS — Z6841 Body Mass Index (BMI) 40.0 and over, adult: Secondary | ICD-10-CM | POA: Diagnosis not present

## 2023-10-02 DIAGNOSIS — R4589 Other symptoms and signs involving emotional state: Secondary | ICD-10-CM

## 2023-10-02 NOTE — Progress Notes (Signed)
Bethanie Dicker, NP-C Phone: [phone]  Ruth Pennington is a 52 y.o. female who presents today for follow up.   Discussed the use of AI scribe software for clinical note transcription with the patient, who gave verbal consent to proceed.  History of Present Illness   The patient, who has been on Zepbound since October 4th, reports a weight loss of 20 pounds. They have been on a weekly injection regimen, recently moving up to a 5mg  dose. The patient reports experiencing side effects such as nausea and diarrhea on the 2.5mg  dose, but surprisingly, these side effects seemed to lessen upon increasing the dose to 5mg .  The patient describes a significant change in dietary habits, with a decreased appetite and aversion to certain foods such as beef and soda. They have been primarily consuming chicken and vegetables, with occasional dairy. The patient also reports a change in their eating pattern, often eating a little bit every few hours and ensuring they finish eating a few hours before bedtime.  Interestingly, the patient reports an improvement in mood since starting the weight loss regimen. They also mention a decrease in chronic pain, which they had been experiencing prior to the weight loss.  The patient has been monitoring their blood pressure at home occasionally, with no significant issues reported. They continue to take lisinopril for blood pressure management.  In addition to their weight loss regimen, the patient reports a need for a mammogram and colonoscopy, which have not yet been scheduled.   The patient is considering whether to increase their injection dose to 7.5mg , expressing concerns about potential side effects and the long-term effectiveness of the medication. They have a refill for the 5mg  dose and are considering staying on this dose for a longer period.      Social History   Tobacco Use  Smoking Status Never  Smokeless Tobacco Never    Current Outpatient Medications  on File Prior to Visit  Medication Sig Dispense Refill   acetaminophen (TYLENOL) 500 MG tablet Take by mouth every 6 (six) hours as needed.      ibuprofen (ADVIL,MOTRIN) 200 MG tablet Take by mouth every 6 (six) hours as needed.      ketoconazole (NIZORAL) 2 % shampoo Apply 1 Application topically 2 (two) times a week. 120 mL 2   lisinopril (ZESTRIL) 10 MG tablet TAKE 1 TABLET(10 MG) BY MOUTH DAILY 90 tablet 3   tirzepatide (ZEPBOUND) 5 MG/0.5ML Pen Inject 5 mg into the skin once a week. 2 mL 0   No current facility-administered medications on file prior to visit.     ROS see history of present illness  Objective  Physical Exam Vitals:   10/02/23 1500  BP: 122/84  Pulse: 100  Temp: 98.6 F (37 C)  SpO2: 98%    BP Readings from Last 3 Encounters:  10/02/23 122/84  03/24/23 124/70  02/26/23 124/80   Wt Readings from Last 3 Encounters:  10/02/23 (!) 335 lb (152 kg)  03/24/23 (!) 351 lb (159.2 kg)  02/26/23 (!) 348 lb 9.6 oz (158.1 kg)    Physical Exam Constitutional:      General: She is not in acute distress.    Appearance: Normal appearance. She is obese.  HENT:     Head: Normocephalic.  Cardiovascular:     Rate and Rhythm: Normal rate and regular rhythm.     Heart sounds: Normal heart sounds.  Pulmonary:     Effort: Pulmonary effort is normal.     Breath  sounds: Normal breath sounds.  Skin:    General: Skin is warm and dry.  Neurological:     General: No focal deficit present.     Mental Status: She is alert.  Psychiatric:        Mood and Affect: Mood normal.        Behavior: Behavior normal.     Assessment/Plan: Please see individual problem list.  Morbid obesity with BMI of 50.0-59.9, adult [facility]) Assessment & Plan: They have lost 20 lbs since starting Zepbound a month ago and are tolerating the medication well, with initial side effects like nausea and diarrhea improving. They are currently on a 5mg  dose, with plans to increase to 7.5mg  as tolerated.  They remain mindful of their diet and hydration, focusing on protein intake and recognizing new hunger cues. We will increase Zepbound to 7.5mg  as tolerated and continue monitoring their diet and hydration, focusing on protein intake.  Orders: -     Zepbound; Inject 7.5 mg into the skin once a week.  Dispense: 2 mL; Refill: 1  Essential hypertension Assessment & Plan: Their blood pressure is well controlled on Lisinopril, with no symptoms of chest pain, shortness of breath, or dizziness reported. We will continue Lisinopril and recommend occasional home blood pressure monitoring.   Depressed mood Assessment & Plan: Their mood has improved since the weight loss and initiation of Zepbound. We will continue the current management.    General Health Maintenance We encouraged them to schedule a mammogram and colonoscopy when feasible.   Return in about 6 months (around 03/31/2024) for Follow up.   Bethanie Dicker, NP-C [facility] - [employer]

## 2023-10-02 NOTE — Patient Instructions (Signed)
YOUR MAMMOGRAM IS DUE, PLEASE CALL AND GET THIS SCHEDULED! [facility] - call [phone]    

## 2023-10-03 ENCOUNTER — Encounter: Payer: Self-pay | Admitting: Nurse Practitioner

## 2023-10-03 MED ORDER — ZEPBOUND 7.5 MG/0.5ML ~~LOC~~ SOAJ
7.5000 mg | SUBCUTANEOUS | 1 refills | Status: DC
Start: 2023-10-03 — End: 2023-10-30

## 2023-10-03 NOTE — Assessment & Plan Note (Signed)
Their mood has improved since the weight loss and initiation of Zepbound. We will continue the current management.

## 2023-10-03 NOTE — Assessment & Plan Note (Signed)
They have lost 20 lbs since starting Zepbound a month ago and are tolerating the medication well, with initial side effects like nausea and diarrhea improving. They are currently on a 5mg  dose, with plans to increase to 7.5mg  as tolerated. They remain mindful of their diet and hydration, focusing on protein intake and recognizing new hunger cues. We will increase Zepbound to 7.5mg  as tolerated and continue monitoring their diet and hydration, focusing on protein intake.

## 2023-10-03 NOTE — Assessment & Plan Note (Signed)
Their blood pressure is well controlled on Lisinopril, with no symptoms of chest pain, shortness of breath, or dizziness reported. We will continue Lisinopril and recommend occasional home blood pressure monitoring.

## 2023-10-07 ENCOUNTER — Encounter: Payer: Self-pay | Admitting: Nurse Practitioner

## 2023-10-13 ENCOUNTER — Telehealth: Payer: Self-pay | Admitting: Nurse Practitioner

## 2023-10-13 NOTE — Telephone Encounter (Signed)
[employer] just called and said they need prior authorization on tirzepatide (ZEPBOUND) 7.5 MG/0.5ML Pen for the patient. The phone number is [phone]. She said she just need prior authorization from her provider.

## 2023-10-14 NOTE — Telephone Encounter (Signed)
Called [facility] and spoke with [facility] Reference #: [id number] and she confirmed that a PA was not needed however the pt tried to pick up the next dose too early which is why it got flagged pt needs to wait to pick up next dose.

## 2023-10-28 ENCOUNTER — Encounter: Payer: Self-pay | Admitting: Nurse Practitioner

## 2023-10-29 ENCOUNTER — Telehealth: Payer: Self-pay

## 2023-10-29 NOTE — Telephone Encounter (Signed)
Pt mentioned something about a PLA that goes along with a Prior Auth, provider did a peer to peer appeal/ review to get Zepbound approved pt has been taking the doses and we were informed all doses were approved as I knwo its a titrated medication.    Pt mentioned something about a PLA mychart msg below I informed she shopuld not need a new PA as it was approved for all doses but that I would ask if our Pharmacy team knew about a PLA. Mychart msg is below:     Can you all start the process for the PA for 7.5 Zepbound? I will need my next box the week of Christmas and I am many people take off extra at the holidays. Also, we are on high deductible insurance and have reached our out of pocket max. Can you do something called a PLA with the PA. It would be a special request to allow a 3 month or 12 pen supply. This would save [place] so much money as the $4000 deductible starts over January 1,2025. Please call me if you have questions

## 2023-10-30 ENCOUNTER — Other Ambulatory Visit: Payer: Self-pay | Admitting: Nurse Practitioner

## 2023-10-30 MED ORDER — ZEPBOUND 7.5 MG/0.5ML ~~LOC~~ SOAJ
7.5000 mg | SUBCUTANEOUS | 0 refills | Status: DC
Start: 2023-10-30 — End: 2024-01-16

## 2023-10-31 ENCOUNTER — Other Ambulatory Visit (HOSPITAL_COMMUNITY): Payer: Self-pay

## 2023-12-23 DIAGNOSIS — J029 Acute pharyngitis, unspecified: Secondary | ICD-10-CM | POA: Diagnosis not present

## 2023-12-23 DIAGNOSIS — R0981 Nasal congestion: Secondary | ICD-10-CM | POA: Diagnosis not present

## 2024-01-15 ENCOUNTER — Telehealth: Payer: Self-pay

## 2024-01-15 ENCOUNTER — Encounter: Payer: Self-pay | Admitting: Nurse Practitioner

## 2024-01-15 NOTE — Telephone Encounter (Signed)
 Pa needed for zepbound

## 2024-01-16 ENCOUNTER — Other Ambulatory Visit: Payer: Self-pay | Admitting: Nurse Practitioner

## 2024-01-16 MED ORDER — ZEPBOUND 10 MG/0.5ML ~~LOC~~ SOAJ
10.0000 mg | SUBCUTANEOUS | 2 refills | Status: DC
Start: 2024-01-16 — End: 2024-03-11

## 2024-01-19 ENCOUNTER — Telehealth: Payer: Self-pay

## 2024-01-19 ENCOUNTER — Other Ambulatory Visit (HOSPITAL_COMMUNITY): Payer: Self-pay

## 2024-01-19 NOTE — Telephone Encounter (Signed)
 Pharmacy Patient Advocate Encounter   Received notification from Pt Calls Messages that prior authorization for Zepbound is required/requested.   Insurance verification completed.   The patient is insured through  [facility]  .   Per test claim: Refill too soon. PA is not needed at this time. Medication was filled 01/05/2024. Next eligible fill date is 01/27/2024.   Per approval letter, approval for Zebpound is good until 03/25/2024

## 2024-01-19 NOTE — Telephone Encounter (Signed)
 Pt has been approved until May per approval letter, PA will be started in April. Test claim unsuccessful due to refill too soon. Thank you

## 2024-01-19 NOTE — Telephone Encounter (Signed)
 Pt informed

## 2024-02-13 ENCOUNTER — Other Ambulatory Visit: Payer: Self-pay | Admitting: Nurse Practitioner

## 2024-02-13 DIAGNOSIS — I10 Essential (primary) hypertension: Secondary | ICD-10-CM

## 2024-02-26 ENCOUNTER — Encounter: Payer: Self-pay | Admitting: Nurse Practitioner

## 2024-03-02 ENCOUNTER — Ambulatory Visit (INDEPENDENT_AMBULATORY_CARE_PROVIDER_SITE_OTHER): Admitting: Nurse Practitioner

## 2024-03-02 VITALS — BP 100/66 | HR 75 | Temp 98.3°F | Ht 65.75 in | Wt 284.8 lb

## 2024-03-02 DIAGNOSIS — I10 Essential (primary) hypertension: Secondary | ICD-10-CM | POA: Diagnosis not present

## 2024-03-02 DIAGNOSIS — L219 Seborrheic dermatitis, unspecified: Secondary | ICD-10-CM

## 2024-03-02 DIAGNOSIS — Z6841 Body Mass Index (BMI) 40.0 and over, adult: Secondary | ICD-10-CM | POA: Diagnosis not present

## 2024-03-02 NOTE — Progress Notes (Unsigned)
 Ruth Dicker, NP-C Phone: [phone]  Ruth Pennington is a 53 y.o. female who presents today for follow up.   Discussed the use of AI scribe software for clinical note transcription with the patient, who gave verbal consent to proceed.  History of Present Illness   Ruth Pennington is a 53 year old female with hypertension who presents with concerns about low blood pressure and dizziness.  She is experiencing dizziness, particularly when standing, with a significant episode occurring last Friday during a tennis match. She attributes this to sitting in a tall camp chair affecting her circulation. Home blood pressure readings have been lower than usual, with today's reading at 100/66 mmHg. She is currently on a low dose of lisinopril.  She started blood pressure medication in 2016 after high readings during a preoperative assessment for a D&C procedure. Initially prescribed atenolol and hydrochlorothiazide without potassium supplementation, she experienced low potassium levels. Atenolol was continued until 2019 when it was discontinued due to low blood pressure during an appendectomy. She resumed medication after experiencing headaches and slightly elevated blood pressure.  She has been on Zepbound 10 mg for weight management, losing 64 pounds over six months. The medication helps with 'food noise and suppression' without excessive side effects. She has been on the 10 mg dose for three weeks. Her current weight is 284 pounds, down from 353 pounds last spring, with a goal of 200 pounds.  Her diet fluctuates, emphasizing protein and fiber to avoid constipation. She is physically active, engaging in walking and circuit training at [employer]. She is working on Museum/gallery exhibitions officer mass, which initially decreased with weight loss but is now improving.  She mentions a history of eczema or psoriasis on her head, for which she uses a topical treatment that is helpful.      Social History   Tobacco  Use  Smoking Status Never  Smokeless Tobacco Never    Current Outpatient Medications on File Prior to Visit  Medication Sig Dispense Refill   tirzepatide (ZEPBOUND) 10 MG/0.5ML Pen Inject 10 mg into the skin once a week. 2 mL 2   acetaminophen (TYLENOL) 500 MG tablet Take by mouth every 6 (six) hours as needed.      ibuprofen (ADVIL,MOTRIN) 200 MG tablet Take by mouth every 6 (six) hours as needed.      No current facility-administered medications on file prior to visit.    ROS see history of present illness  Objective  Physical Exam Vitals:   03/02/24 1345  BP: 100/66  Pulse: 75  Temp: 98.3 F (36.8 C)  SpO2: 99%    BP Readings from Last 3 Encounters:  03/02/24 100/66  10/02/23 122/84  03/24/23 124/70   Wt Readings from Last 3 Encounters:  03/02/24 284 lb 12.8 oz (129.2 kg)  10/02/23 (!) 335 lb (152 kg)  03/24/23 (!) 351 lb (159.2 kg)    Physical Exam Constitutional:      General: She is not in acute distress.    Appearance: Normal appearance. She is obese.  HENT:     Head: Normocephalic.  Cardiovascular:     Rate and Rhythm: Normal rate and regular rhythm.     Heart sounds: Normal heart sounds.  Pulmonary:     Effort: Pulmonary effort is normal.     Breath sounds: Normal breath sounds.  Skin:    General: Skin is warm and dry.  Neurological:     General: No focal deficit present.     Mental Status: She  is alert.  Psychiatric:        Mood and Affect: Mood normal.        Behavior: Behavior normal.     Assessment/Plan: Please see individual problem list.  Morbid obesity with BMI of 45.0-49.9, adult ([facility]) Assessment & Plan: She has experienced significant weight loss on Zepbound, effectively managing cravings. Total loss of 64 pounds. Regular physical activity and a protein-forward diet are maintained. Continue Zepbound 10 mg. Encourage ongoing physical activity and diet. Consider increasing Zepbound to 12.5 mg after the current course, based on comfort  and response. Schedule a follow-up in three months to reassess weight management and medication dosage.   Essential hypertension Assessment & Plan: Episodes of dizziness and lightheadedness are likely due to low blood pressure, with recent readings lower than usual. Lisinopril has been discontinued due to these low readings and weight loss. Advise hydration, especially with warmer weather. She will continue to monitor her blood pressure at home and contact if it become consistently elevated.    Seborrheic dermatitis of scalp Assessment & Plan: Previously responded well to treatment. Refill Ketoconazole shampoo.   Orders: -     Ketoconazole; Apply 1 Application topically 2 (two) times a week.  Dispense: 120 mL; Refill: 5     Return for Annual Exam, then in 3 months for follow up.   Ruth Dicker, NP-C [facility] - [facility]

## 2024-03-03 ENCOUNTER — Encounter: Payer: Self-pay | Admitting: Nurse Practitioner

## 2024-03-03 DIAGNOSIS — E66813 Obesity, class 3: Secondary | ICD-10-CM | POA: Insufficient documentation

## 2024-03-03 MED ORDER — KETOCONAZOLE 2 % EX SHAM: 1.0000 | MEDICATED_SHAMPOO | CUTANEOUS | 5 refills | Status: DC

## 2024-03-03 NOTE — Assessment & Plan Note (Signed)
 Episodes of dizziness and lightheadedness are likely due to low blood pressure, with recent readings lower than usual. Lisinopril has been discontinued due to these low readings and weight loss. Advise hydration, especially with warmer weather. She will continue to monitor her blood pressure at home and contact if it become consistently elevated.

## 2024-03-03 NOTE — Assessment & Plan Note (Signed)
 She has experienced significant weight loss on Zepbound, effectively managing cravings. Total loss of 64 pounds. Regular physical activity and a protein-forward diet are maintained. Continue Zepbound 10 mg. Encourage ongoing physical activity and diet. Consider increasing Zepbound to 12.5 mg after the current course, based on comfort and response. Schedule a follow-up in three months to reassess weight management and medication dosage.

## 2024-03-03 NOTE — Assessment & Plan Note (Signed)
 Previously responded well to treatment. Refill Ketoconazole shampoo.

## 2024-03-05 ENCOUNTER — Other Ambulatory Visit (HOSPITAL_COMMUNITY): Payer: Self-pay

## 2024-03-05 ENCOUNTER — Other Ambulatory Visit: Payer: Self-pay

## 2024-03-05 ENCOUNTER — Telehealth: Payer: Self-pay | Admitting: Pharmacy Technician

## 2024-03-05 NOTE — Telephone Encounter (Signed)
 Pharmacy Patient Advocate Encounter   Received notification from Patient Advice Request messages that prior authorization for Zepbound 10MG /0.5ML pen-injectors is due for renewal.   Insurance verification completed.   The patient is insured through [facility].  Action: PA required; PA submitted to above mentioned insurance via CoverMyMeds Key/confirmation #/EOC [employer] Status is pending

## 2024-03-05 NOTE — Telephone Encounter (Signed)
 Pharmacy Patient Advocate Encounter  Received notification from [facility] that Prior Authorization for Zepbound 10MG /0.5ML pen-injectors has been DENIED.  Full denial letter will be uploaded to the media tab. See denial reason below.   PA #/Case ID/Reference #: [id number]

## 2024-03-05 NOTE — Telephone Encounter (Signed)
 Good morning, PA request has been Submitted. New Encounter has been or will be created for follow up. For additional info see Pharmacy Prior Auth telephone encounter from 03/05/24.

## 2024-03-08 ENCOUNTER — Encounter: Payer: Self-pay | Admitting: Nurse Practitioner

## 2024-03-11 ENCOUNTER — Telehealth: Payer: Self-pay

## 2024-03-11 ENCOUNTER — Other Ambulatory Visit: Payer: Self-pay | Admitting: Nurse Practitioner

## 2024-03-11 DIAGNOSIS — I10 Essential (primary) hypertension: Secondary | ICD-10-CM

## 2024-03-11 DIAGNOSIS — R739 Hyperglycemia, unspecified: Secondary | ICD-10-CM | POA: Insufficient documentation

## 2024-03-11 DIAGNOSIS — E785 Hyperlipidemia, unspecified: Secondary | ICD-10-CM | POA: Insufficient documentation

## 2024-03-11 MED ORDER — ZEPBOUND 10 MG/0.5ML ~~LOC~~ SOAJ
10.0000 mg | SUBCUTANEOUS | 2 refills | Status: DC
Start: 2024-03-11 — End: 2024-05-19

## 2024-03-11 NOTE — Telephone Encounter (Signed)
 Copied from CRM [id number]. Topic: Referral - Prior Authorization Question >> Mar 11, 2024  2:17 PM Fredrica W wrote: Reason for CRM: Patient called states she sent a message on MyChart Regarding denial and no one ever responded and usually they respond within 24 hours. Would like to know state os request. I let her know that the denial was received and sent to provider. She would like a reply. Thank You

## 2024-03-11 NOTE — Assessment & Plan Note (Signed)
 LDL- 133 in 2023 improvement to 97 on recent labs. Likely due to weight loss. The 10-year ASCVD risk score (Arnett DK, et al., 2019) is: 1%. Medication not warranted at this time. Encourage healthy diet and regular exercise for continued improvement.

## 2024-03-17 NOTE — Telephone Encounter (Signed)
Thank you, PA has been submitted

## 2024-03-17 NOTE — Telephone Encounter (Signed)
 Attempting resubmission, please see question below that I'm not finding an answer to in pt's chart. Thank you

## 2024-03-23 ENCOUNTER — Telehealth: Payer: Self-pay

## 2024-03-23 NOTE — Telephone Encounter (Signed)
 Copied from CRM [id number]. Topic: Referral - Prior Authorization Question >> Mar 11, 2024  2:17 PM Fredrica W wrote: Reason for CRM: Patient called states she sent a message on MyChart Regarding denial and no one ever responded and usually they respond within 24 hours. Would like to know state os request. I let her know that the denial was received and sent to provider. She would like a reply. Thank You >> Mar 23, 2024 11:22 AM Chuck Crater wrote: Patient states that insurance still doesn't have Prior Authorization for Zepbound . Patient wants a call From Bluford Burkitt.

## 2024-03-25 ENCOUNTER — Telehealth: Payer: Self-pay

## 2024-03-25 ENCOUNTER — Other Ambulatory Visit (HOSPITAL_COMMUNITY): Payer: Self-pay

## 2024-03-25 NOTE — Telephone Encounter (Signed)
 Spoke with patient at length concerning PA for Zepbound  and advised patient that the steps now are probably in an appeal process which can take insurance 30 days to process.I advised that would send message to pharmacy for update see phone note 03/25/2024 from Pharmacy appeal still pending and could take 30 days patient has been advised.

## 2024-03-25 NOTE — Telephone Encounter (Addendum)
 Patient called to day and wanted to speak with pharmacy team as the Practice Administrator I advised this team is not on site that I would reach out to the team member that submitted the last authorization to see if there is any new on her approval for Zepbound . Could you or soemone on your team please let me know so I can respond to patient.

## 2024-03-25 NOTE — Telephone Encounter (Signed)
 Called pt and informed her that Bluford Burkitt, NP has sent a message to our pharmacy team asking for an update as we have been working on the prior auth, informed her that they asked follow up questions and we answered the questions and was just waiting on an update from them.   I informed her that when medications are not high priority that sometimes other prior auths get handled first I reiterated that Bluford Burkitt did send a message asking for an update on today.    Pt then asked was I the one she spoke to the other day when she called and I informed her that I did not that she spoke to someone with our call center and she then proceeded to say "I need you to hear this" "I asked them was I speaking with someone from my doctors office and they said yes that they were at [address]"  She then stated she does not believe they are actually routing my messages and that they need to pull the recordings of the calls.      I informed her that we did receive a message back stating she wanted to speak with Bluford Burkitt, NP and that the message was routed to Bluford Burkitt, NP however, Bluford Burkitt does see patients and has a lot of calls and mycharts etc to screen through and if a phone note is not listed as high priority it does not get to right away but that Bluford Burkitt does take time and go through her in basket as best as she can.    Pt then said " Now you gotta realize you just said I was not a priority, and do you know how that makes me feel?"  I apologized and stated that was not my intentions at all and I am not saying that she was not a priority but when we mean high priority we mean like calls about chest pain , shortness of breath etc.   Pt then "told me no that's exactly how it is" I then asked pt to hold on for a second and asked Ruth Dykes, Rn our practice adminsitrator to take over/ call and speak with pt.   I informed pt that our Practice Administrator would be giving her a call.

## 2024-03-25 NOTE — Telephone Encounter (Signed)
 Pharmacy Patient Advocate Encounter   Received notification from Pt Calls Messages that prior authorization for Zepbound  is required/requested.   Insurance verification completed.   The patient is insured through [facility] .   Per test claim: PA required; PA submitted to above mentioned insurance via CoverMyMeds Key/confirmation #/EOC [facility] Status is pending   *PA was submitted 03/17/2024, checked status on 03/25/2024, still currently pending (it is considered an appeal and may take up to a month for determination)

## 2024-03-25 NOTE — Telephone Encounter (Signed)
 PA was resubmitted on 03/17/2024, including chart notes prior to Zepbound  therapy and current chart notes, showing weight improvement along with co morbidities. Since it was previously denied, Marlowe Sinclair is likely handling as an appeal which usually takes longer than a normal PA submission. Unfortunately, we have not heard anything back from [facility] as of yet. Thank you

## 2024-03-25 NOTE — Telephone Encounter (Signed)
 Copied from CRM [id number]. Topic: Clinical - Prescription Issue >> Mar 25, 2024  1:32 PM Ruth Pennington wrote: Reason for CRM: Patient is calling in because she is irate that her prior auth for Zepbound  has not been approved yet. She does not believe there is a pharmacy team working on her request.  She would only like to talk to [employer] to get her problem solved

## 2024-03-26 NOTE — Telephone Encounter (Signed)
 Patient notified on 03/25/2024 by me and voiced understanding that the appeal process can take up to 30 days.

## 2024-03-29 ENCOUNTER — Encounter: Payer: Self-pay | Admitting: Nurse Practitioner

## 2024-03-29 ENCOUNTER — Telehealth: Payer: Self-pay

## 2024-03-29 NOTE — Telephone Encounter (Signed)
 Copied from CRM [id number]. Topic: General - Other >> Mar 29, 2024  1:50 PM Lonzell Robin C wrote: Reason for CRM: Patient got denied for tirzepatide  (ZEPBOUND ) 10 MG/0.5ML Pen per insurance and stated that these are the provider lines that were given to her [phone] alternate services number [phone]. Appeal had been denied for medication tirzepatide  (ZEPBOUND ) 10 MG/0.5ML Pen. Dr. Bluford Burkitt would have to requested peer to peer review by calling the numbers above

## 2024-03-30 NOTE — Telephone Encounter (Signed)
 If I can do anything to help with the peer to peer review please let me know.

## 2024-03-30 NOTE — Telephone Encounter (Signed)
 Pharmacy Patient Advocate Encounter  Received notification from [facility] that Prior Authorization for Zepbound  10mg /0.68ml has been DENIED.  Full denial letter will be uploaded to the media tab. See denial reason below.   PA #/Case ID/Reference #: [id number]

## 2024-03-31 ENCOUNTER — Ambulatory Visit: Payer: BC Managed Care – PPO | Admitting: Nurse Practitioner

## 2024-04-07 ENCOUNTER — Other Ambulatory Visit: Payer: Self-pay | Admitting: Nurse Practitioner

## 2024-04-07 DIAGNOSIS — E785 Hyperlipidemia, unspecified: Secondary | ICD-10-CM

## 2024-04-07 DIAGNOSIS — I10 Essential (primary) hypertension: Secondary | ICD-10-CM

## 2024-04-26 DIAGNOSIS — R300 Dysuria: Secondary | ICD-10-CM | POA: Diagnosis not present

## 2024-04-26 DIAGNOSIS — N3000 Acute cystitis without hematuria: Secondary | ICD-10-CM | POA: Diagnosis not present

## 2024-04-26 DIAGNOSIS — R110 Nausea: Secondary | ICD-10-CM | POA: Diagnosis not present

## 2024-04-30 ENCOUNTER — Ambulatory Visit: Payer: Self-pay

## 2024-04-30 NOTE — Telephone Encounter (Signed)
 FYI Only or Action Required?: FYI only for provider  Patient was last seen in primary care on 03/02/2024 by Bluford Burkitt, NP. Called Nurse Triage reporting Abdominal Pain. Symptoms began several days ago. Interventions attempted: Prescription medications: keflex and Rest, hydration, or home remedies. Symptoms are: unchanged.  Triage Disposition: See PCP When Office is Open (Within 3 Days), Home Care  Patient/caregiver understands and will follow disposition?: Yes               Copied from CRM [id number]. Topic: Clinical - Red Word Triage >> Apr 30, 2024  9:12 AM Melissa C wrote: [facility] that prompted transfer to Nurse Triage: patient started vomiting last week, thought it was due to her Zepbound , however then she had UTI symptoms and went to urgent care and got an antibiotic which helped a little but then when she took her Zepbound  shot again her pain and nausea started again. She is not sure if this is all due to the Zepbound  or if she still has a UTI or further issues. Reason for Disposition  Abdominal pain  [1] Abdominal pain is intermittent AND [2] shoots into chest, with sour taste in mouth  Answer Assessment - Initial Assessment Questions 1. LOCATION: "Where does it hurt?"      Upper abd around belly button 2. RADIATION: "Does the pain shoot anywhere else?" (e.g., chest, back)     To lower abd  3. ONSET: "When did the pain begin?" (e.g., minutes, hours or days ago)      A month ago then went away and came back Saturday 4. SUDDEN: "Gradual or sudden onset?"     sudden 5. PATTERN "Does the pain come and go, or is it constant?"    - If it comes and goes: "How long does it last?" "Do you have pain now?"     (Note: Comes and goes means the pain is intermittent. It goes away completely between bouts.)    - If constant: "Is it getting better, staying the same, or getting worse?"      (Note: Constant means the pain never goes away completely; most serious pain is constant and gets  worse.)      Comes and goes 6. SEVERITY: "How bad is the pain?"  (e.g., Scale 1-10; mild, moderate, or severe)    - MILD (1-3): Doesn't interfere with normal activities, abdomen soft and not tender to touch..     - MODERATE (4-7): Interferes with normal activities or awakens from sleep, abdomen tender to touch.     - SEVERE (8-10): Excruciating pain, doubled over, unable to do any normal activities.       None at the moment 7. RECURRENT SYMPTOM: "Have you ever had this type of stomach pain before?" If Yes, ask: "When was the last time?" and "What happened that time?"      Off and on for a month 8. AGGRAVATING FACTORS: "Does anything seem to cause this pain?" (e.g., foods, stress, alcohol)     no 9. CARDIAC SYMPTOMS: "Do you have any of the following symptoms: chest pain, difficulty breathing, sweating, nausea?"     nausea 10. OTHER SYMPTOMS: "Do you have any other symptoms?" (e.g., back pain, diarrhea, fever, urination pain, vomiting)       Urination pain after urinating and urgency, dry heaving, vomiting, nausea  Protocols used: Abdominal Pain - Upper-A-AH

## 2024-05-03 ENCOUNTER — Telehealth: Payer: Self-pay

## 2024-05-03 NOTE — Telephone Encounter (Signed)
 [employer] letter received informing that pts plan will no longer cover weight loss medications letter has been placed in provider to be signed folder for initialing and to be scanned into chart

## 2024-05-04 ENCOUNTER — Ambulatory Visit (INDEPENDENT_AMBULATORY_CARE_PROVIDER_SITE_OTHER)

## 2024-05-04 VITALS — BP 110/70 | HR 91 | Ht 65.75 in | Wt 270.8 lb

## 2024-05-04 DIAGNOSIS — R110 Nausea: Secondary | ICD-10-CM

## 2024-05-04 DIAGNOSIS — R1084 Generalized abdominal pain: Secondary | ICD-10-CM | POA: Insufficient documentation

## 2024-05-04 MED ORDER — PANTOPRAZOLE SODIUM 20 MG PO TBEC: 20.0000 mg | DELAYED_RELEASE_TABLET | Freq: Every day | ORAL | 0 refills | Status: DC

## 2024-05-04 NOTE — Progress Notes (Signed)
 Acute Office Visit  Subjective:     Patient ID: Ruth Pennington, female    DOB: 12/04/1970, 53 y.o.   MRN: [id number]  Chief Complaint  Patient presents with   Nausea   Emesis   Abdominal Pain    Patient is in today for following acute concerns:   HPI - Nausea, vomiting, urinary frequency, burning after urination for which she was seen at [facility] on 04/26/24. She was treated with Cephalexin and Zofran  on 04/26/24.   - Patient is on Zepbound  10 mg/0.5 ml for weight loss. Her last injection was on 04/27/24. She started to get nauseated and one episode of vomiting on 04/29/24. She has one dose of Zepbound  left as insurance is no longer covering her prescription.   - Since UC visit: She is feeling better. No diarrhea, no vomiting, since last Thursday. No fever, chills.  No blood in the stool. No urinary symptoms. She feels tightness all over her stomach but no pain which is also improved. Patient took 3 tabs of Zofran  total when nauseated which helped. She does not drink alcohol. Used to get heartburn few years back which improved after she lost weight.    ROS As per HPI    Objective:    BP 110/70   Pulse 91   Ht 5' 5.75" (1.67 m)   Wt 270 lb 12.8 oz (122.8 kg)   SpO2 97%   BMI 44.04 kg/m    Physical Exam Constitutional:      Appearance: She is obese.  HENT:     Head: Normocephalic.     Mouth/Throat:     Mouth: Mucous membranes are moist.  Cardiovascular:     Rate and Rhythm: Normal rate.  Pulmonary:     Breath sounds: No wheezing.  Abdominal:     General: Abdomen is protuberant. A surgical scar is present. Bowel sounds are normal.     Tenderness: There is generalized abdominal tenderness (generalized tenderness on deep palpation). There is no guarding or rebound. Negative signs include Murphy's sign.     Hernia: There is no hernia in the umbilical area.     Comments: Healed linear surgical scar on the abdomen near the navel, with no signs of infection.  Skin:    General:  Skin is warm.    No results found for any visits on 05/04/24.     Assessment & Plan:  Nausea Assessment & Plan: - Resolved now. D/D includes medication s/e from Zepbound , GERD, pancreatitis, gastroparesis.   - Zofran  4mg  q8 hourly prn if you develop nausea, vomiting. Has this prescribed from recent UC visit and does not need refill.  We discussed potential referral to gastroenterologist. Patient will stop Zepbound , monitor symptoms and f/u with her PCP before deciding on GI referral. She is willing to see GI if her current symptoms persists despite d/c  Zepbound .  -Encourage daily water intake to 50-60 oz daily.   -If you have the sulfur like taste/bitter taste in your mouth,  try Protonix 20 mg, take this in empty stomach and avoid eating for 30 minutes after taking this medication.   -Also has h/o elevated LFTs. Discussed updating labs, patient declined and plans on getting labs updated during her upcoming appointment with PCP on 06/03/24.  - If abdominal pain worsening/belt like distribution/vomiting persists, reduced oral intake recommend emergent medical evaluation.    Generalized abdominal discomfort Assessment & Plan: Refer to plan per nausea from 05/04/24 visit   Other orders -  Pantoprazole Sodium; Take 1 tablet (20 mg total) by mouth daily.  Dispense: 30 tablet; Refill: 0  I spent 30 minutes on the day of this face-to-face encounter reviewing the patient's medical and surgical history, current medications, ongoing concerns, and reviewing the differential diagnosis, assessment, plan and follow up instructions with the patient.   Return Has appointment with PCP on 06/03/24.  Jacklin Mascot, MD

## 2024-05-04 NOTE — Patient Instructions (Addendum)
-   Eat smaller, more frequent meals and avoid fatty or spicy foods that can exacerbate discomfort.  - It's okay to take Zofran  if you start to get nauseated or have vomiting episode.  - We discussed potential referral to gastroenterologist. I recommend you stop Zepbound , monitor your symptoms and if your symptoms persists I would highly encourage seeing a GI doctor.  - Encourage daily water intake to 50-60 oz daily.  - If you have the sulfur like taste in your mouth, it's okay to take Protonix 20 mg, take this in empty stomach and avoid eating for 30 minutes after taking this medication.

## 2024-05-04 NOTE — Assessment & Plan Note (Signed)
 Refer to plan per nausea from 05/04/24 visit

## 2024-05-04 NOTE — Telephone Encounter (Signed)
 Form placed in red folder to be scanned into chart

## 2024-05-04 NOTE — Assessment & Plan Note (Addendum)
-   Resolved now. D/D includes medication s/e from Zepbound , GERD, pancreatitis, gastroparesis.   - Zofran  4mg  q8 hourly prn if you develop nausea, vomiting. Has this prescribed from recent UC visit and does not need refill.  We discussed potential referral to gastroenterologist. Patient will stop Zepbound , monitor symptoms and f/u with her PCP before deciding on GI referral. She is willing to see GI if her current symptoms persists despite d/c  Zepbound .  -Encourage daily water intake to 50-60 oz daily.   -If you have the sulfur like taste/bitter taste in your mouth,  try Protonix 20 mg, take this in empty stomach and avoid eating for 30 minutes after taking this medication.   -Also has h/o elevated LFTs. Discussed updating labs, patient declined and plans on getting labs updated during her upcoming appointment with PCP on 06/03/24.  - If abdominal pain worsening/belt like distribution/vomiting persists, reduced oral intake recommend emergent medical evaluation.

## 2024-05-17 ENCOUNTER — Inpatient Hospital Stay

## 2024-05-17 ENCOUNTER — Inpatient Hospital Stay
Admission: EM | Admit: 2024-05-17 | Discharge: 2024-05-19 | DRG: 394 | Disposition: A | Discharge status: Home / Self Care | Attending: Internal Medicine | Admitting: Internal Medicine

## 2024-05-17 ENCOUNTER — Emergency Department

## 2024-05-17 ENCOUNTER — Other Ambulatory Visit: Payer: Self-pay

## 2024-05-17 DIAGNOSIS — Z83511 Family history of glaucoma: Secondary | ICD-10-CM

## 2024-05-17 DIAGNOSIS — N200 Calculus of kidney: Secondary | ICD-10-CM | POA: Diagnosis not present

## 2024-05-17 DIAGNOSIS — Z8542 Personal history of malignant neoplasm of other parts of uterus: Secondary | ICD-10-CM

## 2024-05-17 DIAGNOSIS — D72829 Elevated white blood cell count, unspecified: Secondary | ICD-10-CM | POA: Diagnosis not present

## 2024-05-17 DIAGNOSIS — Z9049 Acquired absence of other specified parts of digestive tract: Secondary | ICD-10-CM | POA: Diagnosis not present

## 2024-05-17 DIAGNOSIS — Z7985 Long-term (current) use of injectable non-insulin antidiabetic drugs: Secondary | ICD-10-CM | POA: Diagnosis not present

## 2024-05-17 DIAGNOSIS — Z6841 Body Mass Index (BMI) 40.0 and over, adult: Secondary | ICD-10-CM

## 2024-05-17 DIAGNOSIS — K5669 Other partial intestinal obstruction: Secondary | ICD-10-CM | POA: Diagnosis not present

## 2024-05-17 DIAGNOSIS — Z833 Family history of diabetes mellitus: Secondary | ICD-10-CM | POA: Diagnosis not present

## 2024-05-17 DIAGNOSIS — K420 Umbilical hernia with obstruction, without gangrene: Principal | ICD-10-CM | POA: Diagnosis present

## 2024-05-17 DIAGNOSIS — K565 Intestinal adhesions [bands], unspecified as to partial versus complete obstruction: Secondary | ICD-10-CM | POA: Diagnosis present

## 2024-05-17 DIAGNOSIS — E785 Hyperlipidemia, unspecified: Secondary | ICD-10-CM | POA: Diagnosis present

## 2024-05-17 DIAGNOSIS — E66813 Obesity, class 3: Secondary | ICD-10-CM | POA: Diagnosis present

## 2024-05-17 DIAGNOSIS — Z1152 Encounter for screening for COVID-19: Secondary | ICD-10-CM | POA: Diagnosis not present

## 2024-05-17 DIAGNOSIS — Z79899 Other long term (current) drug therapy: Secondary | ICD-10-CM | POA: Diagnosis not present

## 2024-05-17 DIAGNOSIS — I10 Essential (primary) hypertension: Secondary | ICD-10-CM | POA: Diagnosis not present

## 2024-05-17 DIAGNOSIS — K5730 Diverticulosis of large intestine without perforation or abscess without bleeding: Secondary | ICD-10-CM | POA: Diagnosis not present

## 2024-05-17 DIAGNOSIS — K56609 Unspecified intestinal obstruction, unspecified as to partial versus complete obstruction: Principal | ICD-10-CM | POA: Diagnosis present

## 2024-05-17 DIAGNOSIS — E860 Dehydration: Secondary | ICD-10-CM | POA: Diagnosis present

## 2024-05-17 DIAGNOSIS — R739 Hyperglycemia, unspecified: Secondary | ICD-10-CM | POA: Diagnosis not present

## 2024-05-17 DIAGNOSIS — R188 Other ascites: Secondary | ICD-10-CM | POA: Diagnosis not present

## 2024-05-17 DIAGNOSIS — R1013 Epigastric pain: Secondary | ICD-10-CM | POA: Diagnosis not present

## 2024-05-17 DIAGNOSIS — Z820 Family history of epilepsy and other diseases of the nervous system: Secondary | ICD-10-CM

## 2024-05-17 DIAGNOSIS — Z8249 Family history of ischemic heart disease and other diseases of the circulatory system: Secondary | ICD-10-CM

## 2024-05-17 DIAGNOSIS — Z4682 Encounter for fitting and adjustment of non-vascular catheter: Secondary | ICD-10-CM | POA: Diagnosis not present

## 2024-05-17 LAB — CBC
HCT: 48.5 % — ABNORMAL HIGH (ref 36.0–46.0)
Hemoglobin: 17.2 g/dL — ABNORMAL HIGH (ref 12.0–15.0)
MCH: 31.2 pg (ref 26.0–34.0)
MCHC: 35.5 g/dL (ref 30.0–36.0)
MCV: 87.9 fL (ref 80.0–100.0)
Platelets: 296 10*3/uL (ref 150–400)
RBC: 5.52 MIL/uL — ABNORMAL HIGH (ref 3.87–5.11)
RDW: 12.8 % (ref 11.5–15.5)
WBC: 14.8 10*3/uL — ABNORMAL HIGH (ref 4.0–10.5)
nRBC: 0 % (ref 0.0–0.2)

## 2024-05-17 LAB — COMPREHENSIVE METABOLIC PANEL WITH GFR
ALT: 24 U/L (ref 0–44)
AST: 23 U/L (ref 15–41)
Albumin: 3.9 g/dL (ref 3.5–5.0)
Alkaline Phosphatase: 82 U/L (ref 38–126)
Anion gap: 8 (ref 5–15)
BUN: 13 mg/dL (ref 6–20)
CO2: 23 mmol/L (ref 22–32)
Calcium: 9.7 mg/dL (ref 8.9–10.3)
Chloride: 105 mmol/L (ref 98–111)
Creatinine, Ser: 0.66 mg/dL (ref 0.44–1.00)
GFR, Estimated: >60 mL/min (ref >60)
Glucose, Bld: 146 mg/dL — ABNORMAL HIGH (ref 70–99)
Potassium: 3.9 mmol/L (ref 3.5–5.1)
Sodium: 136 mmol/L (ref 135–145)
Total Bilirubin: 1.5 mg/dL — ABNORMAL HIGH (ref 0.0–1.2)
Total Protein: 7.8 g/dL (ref 6.5–8.1)

## 2024-05-17 LAB — TROPONIN I (HIGH SENSITIVITY)
Troponin I (High Sensitivity): 7 ng/L (ref <18)
Troponin I (High Sensitivity): 6 ng/L (ref <18)

## 2024-05-17 LAB — URINALYSIS, ROUTINE W REFLEX MICROSCOPIC
Bilirubin Urine: NEGATIVE
Glucose, UA: NEGATIVE mg/dL
Ketones, ur: NEGATIVE mg/dL
Nitrite: NEGATIVE
Protein, ur: 30 mg/dL — AB
Specific Gravity, Urine: 1.026 (ref 1.005–1.030)
pH: 5 (ref 5.0–8.0)

## 2024-05-17 LAB — LACTIC ACID, PLASMA
Lactic Acid, Venous: 1 mmol/L (ref 0.5–1.9)
Lactic Acid, Venous: 0.9 mmol/L (ref 0.5–1.9)

## 2024-05-17 LAB — RESP PANEL BY RT-PCR (RSV, FLU A&B, COVID)  RVPGX2
Influenza A by PCR: NEGATIVE
Influenza B by PCR: NEGATIVE
Resp Syncytial Virus by PCR: NEGATIVE
SARS Coronavirus 2 by RT PCR: NEGATIVE

## 2024-05-17 LAB — HIV ANTIBODY (ROUTINE TESTING W REFLEX): HIV Screen 4th Generation wRfx: NONREACTIVE

## 2024-05-17 LAB — LIPASE, BLOOD: Lipase: 29 U/L (ref 11–51)

## 2024-05-17 MED ORDER — SODIUM CHLORIDE 0.9 % IV BOLUS
1000.0000 mL | Freq: Once | INTRAVENOUS | Status: AC
  Administered 2024-05-17: 1000 mL via INTRAVENOUS

## 2024-05-17 MED ORDER — ACETAMINOPHEN 650 MG RE SUPP: 650.0000 mg | Freq: Four times a day (QID) | RECTAL | Status: DC | PRN

## 2024-05-17 MED ORDER — ONDANSETRON HCL 4 MG/2ML IJ SOLN: 4.0000 mg | Freq: Four times a day (QID) | INTRAMUSCULAR | Status: DC | PRN

## 2024-05-17 MED ORDER — ONDANSETRON 4 MG PO TBDP: 4.0000 mg | ORAL_TABLET | Freq: Four times a day (QID) | ORAL | Status: DC | PRN

## 2024-05-17 MED ORDER — LACTATED RINGERS IV SOLN: INTRAVENOUS | Status: DC

## 2024-05-17 MED ORDER — ONDANSETRON HCL 4 MG/2ML IJ SOLN
4.0000 mg | Freq: Once | INTRAMUSCULAR | Status: AC
  Administered 2024-05-17: 4 mg via INTRAVENOUS
  Filled 2024-05-17: qty 2

## 2024-05-17 MED ORDER — DIATRIZOATE MEGLUMINE & SODIUM 66-10 % PO SOLN
90.0000 mL | Freq: Once | ORAL | Status: AC
  Administered 2024-05-17: 90 mL via NASOGASTRIC

## 2024-05-17 MED ORDER — ACETAMINOPHEN 325 MG PO TABS
650.0000 mg | ORAL_TABLET | Freq: Four times a day (QID) | ORAL | Status: DC | PRN
Start: 2024-05-17 — End: 2024-05-19

## 2024-05-17 MED ORDER — HYDRALAZINE HCL 20 MG/ML IJ SOLN: 10.0000 mg | INTRAMUSCULAR | Status: DC | PRN

## 2024-05-17 MED ORDER — MORPHINE SULFATE (PF) 2 MG/ML IV SOLN: 2.0000 mg | INTRAVENOUS | Status: DC | PRN

## 2024-05-17 MED ORDER — OXYCODONE HCL 5 MG PO TABS: 5.0000 mg | ORAL_TABLET | ORAL | Status: DC | PRN

## 2024-05-17 MED ORDER — BENZOCAINE 20 % MT SOLN
Freq: Once | OROMUCOSAL | Status: AC
  Administered 2024-05-17: 1 via OROMUCOSAL
  Filled 2024-05-17: qty 1

## 2024-05-17 MED ORDER — IOHEXOL 300 MG/ML  SOLN
100.0000 mL | Freq: Once | INTRAMUSCULAR | Status: AC | PRN
  Administered 2024-05-17: 100 mL via INTRAVENOUS

## 2024-05-17 NOTE — ED Triage Notes (Signed)
 Pt to ED via POV from home c/o lower abd pain. Staretd yesterday. Vomited 5x since midnight. Denies CP, SOB, fevers, dizziness

## 2024-05-17 NOTE — H&P (Signed)
 History and Physical    Patient: Ruth Pennington FMW:[id number] DOB: Jul 30, 1971 DOA: 05/17/2024 DOS: the patient was seen and examined on 05/17/2024 PCP: Gretel App, NP  Patient coming from: Home - lives with husband and children; NOK: Husband, [phone]   Chief Complaint: Abdominal pain  HPI: Ruth Pennington is a 53 y.o. female with medical history significant of endometrial CA (2016), HTN, and morbid obesity who presented on 6/23 with abdominal pain.  She reports the development of abdominal pain yesterday afternoon with vomiting about 11pm.  She has had prior episodes similar x 3 since Mother's Day and her PCP told her to come the ER if it happened again.  They initially thought it might be related to Zepbound  but she stopped in June 3.  She has had multiple abdominal surgeries.  She has noticed ?umbilical hernia.  Last emesis was about 3-4AM.  Pain was worse than with labor, now having small episodic pain.  She feels like I'm on the verge of a fever and had diarrhea.  She is extremely anxious about NG tube placement - reports having a very strong gag reflex and trauma from prior hospital experiences.    ER Course:  Vomiting, abdominal pain.  H/o abdominal surgeries.  CT with SBO.  No emesis here, no fever.  WBC 14.8.  Called surgery, Dr. Cesar will see.  NPO, NG tube ordered.       Review of Systems: As mentioned in the history of present illness. All other systems reviewed and are negative. Past Medical History:  Diagnosis Date   Endometrial cancer (HCC)    Endometrial cancer (HCC)    Hypertension    Hypokalemia    Obesity, morbid (HCC) 01/09/2015   Past Surgical History:  Procedure Laterality Date   ABDOMINAL HYSTERECTOMY     cervix removed    APPENDECTOMY     CHOLECYSTECTOMY     LAPAROSCOPIC APPENDECTOMY N/A 03/06/2018   Procedure: APPENDECTOMY LAPAROSCOPIC;  Surgeon: Lorrene Elgin MOULD, MD;  Location: [facility] ORS;  Service: General;  Laterality: N/A;   OOPHORECTOMY  Bilateral 04/01/2018   Social History:  reports that she has never smoked. She has never used smokeless tobacco. She reports current alcohol use. She reports that she does not use drugs.  No Known Allergies  Family History  Problem Relation Age of Onset   Hypertension Mother    Aortic stenosis Mother    Other Mother        Trigeminal neuralgia   Glaucoma Father    Hypertension Father    Diabetes Father    Prostate cancer Father    Prostate cancer Brother     Prior to Admission medications   Medication Sig Start Date End Date Taking? Authorizing Provider  acetaminophen  (TYLENOL ) 500 MG tablet Take by mouth every 6 (six) hours as needed.  04/04/18   [provider]  ibuprofen (ADVIL,MOTRIN) 200 MG tablet Take by mouth every 6 (six) hours as needed.     [provider]  ketoconazole  (NIZORAL ) 2 % shampoo Apply 1 Application topically 2 (two) times a week. 03/03/24   Gretel App, NP  pantoprazole  (PROTONIX ) 20 MG tablet Take 1 tablet (20 mg total) by mouth daily. 05/04/24   Bair, Kalpana, MD  tirzepatide  (ZEPBOUND ) 10 MG/0.5ML Pen Inject 10 mg into the skin once a week. 03/11/24   Gretel App, NP    Physical Exam: Vitals:   05/17/24 0916 05/17/24 1031 05/17/24 1226 05/17/24 1313  BP: 116/71 (!) 120/7 122/77 (!) 102/49  Pulse:  80 78 81  Resp:  18 18 18   Temp:  98 F (36.7 C) 98 F (36.7 C) 98.6 F (37 C)  TempSrc:  Oral  Oral  SpO2:  98% 98% 98%  Weight:      Height:       General:  Appears calm and comfortable and is in NAD Eyes:   EOMI, normal lids, iris ENT:  grossly normal hearing, lips & tongue, mmm Neck:  no LAD, masses or thyromegaly Cardiovascular:  RRR. No LE edema.  Respiratory:   CTA bilaterally with no wheezes/rales/rhonchi.  Normal respiratory effort. Abdomen:  soft, diffusely TTP; difficult exam due to very large pannus, surgical scars noted with apparent umbilical hernia Skin:  no rash or induration seen on limited exam Musculoskeletal:   grossly normal tone BUE/BLE, good ROM, no bony abnormality Psychiatric:  grossly normal mood and affect, speech fluent and appropriate, AOx3 Neurologic:  CN 2-12 grossly intact, moves all extremities in coordinated fashion; ambulated to the bathroom without assistance or difficulty   Radiological Exams on Admission: Independently reviewed - see discussion in A/P where applicable  CT ABDOMEN PELVIS W CONTRAST Result Date: 05/17/2024 CLINICAL DATA:  Epigastric pain. Lower abdominal pressure and nausea and vomiting. EXAM: CT ABDOMEN AND PELVIS WITH CONTRAST TECHNIQUE: Multidetector CT imaging of the abdomen and pelvis was performed using the standard protocol following bolus administration of intravenous contrast. RADIATION DOSE REDUCTION: This exam was performed according to the departmental dose-optimization program which includes automated exposure control, adjustment of the mA and/or kV according to patient size and/or use of iterative reconstruction technique. CONTRAST:  OMNIPAQUE  IOHEXOL  300 MG/ML  SOLN COMPARISON:  03/06/2018 FINDINGS: Lower chest: No acute abnormality. Hepatobiliary: No focal suspicious liver abnormality. Status post cholecystectomy. Mild fusiform dilatation of the common bile duct measures up to 8 mm, image 51/5. This is similar to the previous exam. Pancreas: Unremarkable. No pancreatic ductal dilatation or surrounding inflammatory changes. Spleen: Normal in size without focal abnormality. Adrenals/Urinary Tract: Normal adrenal glands. Bilateral lower pole kidney stones. The largest stone is in the inferior pole of the right kidney measuring 6 mm, image 47/2. No kidney mass or signs of obstructive uropathy. Urinary bladder appears normal. Stomach/Bowel: Stomach appears normal. There is abnormal dilatation involving the proximal and mid small bowel loops. There is a large infraumbilical hernia which contains dilated loops of small bowel. Transition point to decreased caliber  distal small bowel noted within the hernia, image 81/6 and image 71/2. normal appearance of the colon. Distal colonic diverticulosis without signs of acute diverticulitis. Vascular/Lymphatic: Aortic atherosclerosis. No aneurysm. No signs abdominopelvic adenopathy. Reproductive: Status post hysterectomy.  No adnexal mass identified. Other: Small volume of free fluid is noted within the abdomen. No focal fluid collections identified. No signs of pneumoperitoneum. Musculoskeletal: No acute or significant osseous findings. IMPRESSION: 1. Examination is positive for small bowel obstruction secondary to large infraumbilical hernia which contains dilated loops of small bowel. Transition point to decreased caliber distal small bowel noted within the hernia. 2. Small volume of free fluid is noted within the abdomen. No focal fluid collections identified. 3. Bilateral nonobstructing kidney stones. 4. Distal colonic diverticulosis without signs of acute diverticulitis. 5.  Aortic Atherosclerosis (ICD10-I70.0). Electronically Signed   By: Waddell Calk M.D.   On: 05/17/2024 10:32    EKG: Independently reviewed.  NSR with rate 99; RBBB, LAFB; nonspecific ST changes with no evidence of acute ischemia   Labs on Admission: I have personally reviewed the available  labs and imaging studies at the time of the admission.  Pertinent labs:    Glucose 146 HS troponin 7 WBC 14.8 Hgb 17.2 COVID/flu/RSV negative UA: small Hgb, trace LE, 30 protein, rare bacteria Lactate 1   Assessment and Plan: Principal Problem:   SBO (small bowel obstruction) (HCC) Active Problems:   Essential hypertension   History of endometrial cancer   Obesity, Class III, BMI 40-49.9 (morbid obesity)   Hyperlipidemia   Hyperglycemia    SBO Patient with prior h/o abdominal surgeries presenting with acute onset of abdominal pain with n/v and CT findings c/w SBO Will admit to Med Surg Gen Surg consulted by ER; currently no indication for  surgical intervention 80% of SBO will resolve without surgery; High-grade SBO can usually be safely managed non-operatively If PO contrast reaches colon within 24 hours, SBO will very likely certainly resolve without surgery The challenge here is that she has been having recurrent likely partial SBOs leading up to this and has a large infraumbilical hernia which is likely the cause NPO for bowel rest NG tube ordered by surgery but patient prefers to avoid if possible IVF hydration Pain control with morphine  Current guidelines recommend that patients without resolution undergo surgery by 3-5 days  H/o endometrial CA Initial surgery in 2016 B oophorectomy in 2019 with pathology negative for malignancy but complicated by wound infection She was last seen in 03/2019 with plan for RTC in 6 months and appears to have been lost to f/u  HTN No home meds BP 102/49-142/85 Will add prn IV hydralazine  HLD Does not need medication at this time, per NP visit on 4/17  Hyperglycemia Glucose 146 on presentation A1c over a year ago with 5.4 Will recheck A1c Hold SSI for now  Morbid/class 3 obesity Body mass index is 43.58 kg/m.SABRA  Weight loss should be encouraged on an ongoing basis Zepbound  is on hold for now given her abdominal pain; this can likely be resumed at some point Outpatient PCP/bariatric medicine f/u encouraged Significantly low or high BMI is associated with higher medical risk including morbidity and mortality      Advance Care Planning:   Code Status: Full Code - Code status was discussed with the patient and/or family at the time of admission.  The patient would want to receive full resuscitative measures at this time.   Consults: Surgery  DVT Prophylaxis: SCDs  Family Communication: Husband was present throughout evaluation  Severity of Illness: The appropriate patient status for this patient is INPATIENT. Inpatient status is judged to be reasonable and necessary in  order to provide the required intensity of service to ensure the patient's safety. The patient's presenting symptoms, physical exam findings, and initial radiographic and laboratory data in the context of their chronic comorbidities is felt to place them at high risk for further clinical deterioration. Furthermore, it is not anticipated that the patient will be medically stable for discharge from the hospital within 2 midnights of admission.   * I certify that at the point of admission it is my clinical judgment that the patient will require inpatient hospital care spanning beyond 2 midnights from the point of admission due to high intensity of service, high risk for further deterioration and high frequency of surveillance required.*  Author: Delon Herald, MD 05/17/2024 1:16 PM  For on call review [email or web address].

## 2024-05-17 NOTE — ED Notes (Signed)
Pt refused NG tube at this time.

## 2024-05-17 NOTE — Consult Note (Signed)
 [facility]-General Surgery  SURGICAL CONSULTATION NOTE    HISTORY OF PRESENT ILLNESS (HPI):  53 y.o. female presented to [facility] ED today for evaluation of abdominal pain, nausea and vomiting. Patient reports this is about the fourth time this has occur in the last month or so. She had been to her urgent care and PCP where they initially thought her symptoms were related to Zepbound  so it was stopped on June 3rd. She states this time the abdominal pain is more intense. It's non-radiating. It started yesterday afternoon and was not able to sleep last night. No known alleviating or aggravating factors. Last non-bloody emesis episode was this morning around 3:00 am. Has had two episodes of diarrhea while in the ED this morning. Reports taking Metamucil about twice a day for constipation.  Denies hematochezia or melena. Patient also states she's noticed a bulge protruding through her abdomen when exercising. Has become more pronounced since losing weight. Patient has had a few abdominal surgeries including appendectomy, cholecystectomy and oophorectomy. Patient denies any fevers or chills.  In the ED, patient was tachycardiac with HR of 110, afebrile with stable BP of 116/85. Labs show leukocytosis with WBC of 14.8 and Hbg of 17.2. LFTs are within normal range, and total bilirubin slight elevated of 1.5. Lipase was also normal. CT of abdomen and pelvis show small bowel obstruction secondary to large infraumbilical hernia containing dilated loops of small bowel.   Surgery is consulted by physician Dr. Barbarann in this context for evaluation and management of small bowel obstruction.   PAST MEDICAL HISTORY (PMH):  Past Medical History:  Diagnosis Date   Endometrial cancer (HCC)    Endometrial cancer (HCC)    Hypertension    Hypokalemia    Obesity, morbid (HCC) 01/09/2015     PAST SURGICAL HISTORY (PSH):  Past Surgical History:  Procedure Laterality Date   ABDOMINAL HYSTERECTOMY     cervix removed     APPENDECTOMY     CHOLECYSTECTOMY     LAPAROSCOPIC APPENDECTOMY N/A 03/06/2018   Procedure: APPENDECTOMY LAPAROSCOPIC;  Surgeon: Lorrene Elgin MOULD, MD;  Location: [facility] ORS;  Service: General;  Laterality: N/A;   OOPHORECTOMY Bilateral 04/01/2018     MEDICATIONS:  Prior to Admission medications   Medication Sig Start Date End Date Taking? Authorizing Provider  acetaminophen  (TYLENOL ) 500 MG tablet Take by mouth every 6 (six) hours as needed.  04/04/18   [provider]  ibuprofen (ADVIL,MOTRIN) 200 MG tablet Take by mouth every 6 (six) hours as needed.     [provider]  ketoconazole  (NIZORAL ) 2 % shampoo Apply 1 Application topically 2 (two) times a week. 03/03/24   Gretel App, NP  pantoprazole  (PROTONIX ) 20 MG tablet Take 1 tablet (20 mg total) by mouth daily. 05/04/24   Bair, Kalpana, MD  tirzepatide  (ZEPBOUND ) 10 MG/0.5ML Pen Inject 10 mg into the skin once a week. 03/11/24   Gretel App, NP     ALLERGIES:  No Known Allergies   SOCIAL HISTORY:  Social History   Socioeconomic History   Marital status: Married    Spouse name: Theo   Number of children: 5   Years of education: college   Highest education level: Bachelor's degree (e.g., BA, AB, BS)  Occupational History   Not on file  Tobacco Use   Smoking status: Never   Smokeless tobacco: Never  Substance and Sexual Activity   Alcohol use: Yes    Comment: occassionally - 3 times a year   Drug use: Never  Sexual activity: Yes    Birth control/protection: Surgical  Other Topics Concern   Not on file  Social History Narrative   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   Has 5 children - 3 at home, 2 in college   Enjoys: planning cruises, playing candy crush   Exercise: hoping to get back in walking regularly   Diet: avoids carbs   Social Drivers of Health   Financial Resource Strain: Low Risk  (02/27/2024)   Overall Financial Resource Strain (CARDIA)    Difficulty of  Paying Living Expenses: Not hard at all  Food Insecurity: No Food Insecurity (02/27/2024)   Hunger Vital Sign    Worried About Running Out of Food in the Last Year: Never true    Ran Out of Food in the Last Year: Never true  Transportation Needs: No Transportation Needs (02/27/2024)   PRAPARE - Administrator, Civil Service (Medical): No    Lack of Transportation (Non-Medical): No  Physical Activity: Sufficiently Active (02/27/2024)   Exercise Vital Sign    Days of Exercise per Week: 6 days    Minutes of Exercise per Session: 30 min  Stress: No Stress Concern Present (02/27/2024)   [employer] of Occupational Health - Occupational Stress Questionnaire    Feeling of Stress : Not at all  Social Connections: Socially Integrated (02/27/2024)   Social Connection and Isolation Panel    Frequency of Communication with Friends and Family: More than three times a week    Frequency of Social Gatherings with Friends and Family: More than three times a week    Attends Religious Services: More than 4 times per year    Active Member of [employer] or Organizations: Yes    Attends Engineer, structural: More than 4 times per year    Marital Status: Married  Catering manager Violence: Not on file     FAMILY HISTORY:  Family History  Problem Relation Age of Onset   Hypertension Mother    Aortic stenosis Mother    Other Mother        Trigeminal neuralgia   Glaucoma Father    Hypertension Father    Diabetes Father    Prostate cancer Father    Prostate cancer Brother       REVIEW OF SYSTEMS:  Review of Systems  Constitutional:  Negative for chills and fever.  Respiratory:  Negative for cough and wheezing.   Cardiovascular:  Negative for chest pain and palpitations.  Gastrointestinal:  Positive for abdominal pain, diarrhea, nausea and vomiting. Negative for blood in stool and melena.    VITAL SIGNS:  Temp:  [98 F (36.7 C)-98.7 F (37.1 C)] 98 F (36.7 C) (06/23 1226) Pulse  Rate:  [78-110] 78 (06/23 1226) Resp:  [18] 18 (06/23 1226) BP: (116-142)/(7-85) 122/77 (06/23 1226) SpO2:  [98 %] 98 % (06/23 1226) Weight:  [122.5 kg] 122.5 kg (06/23 0629)     Height: 5' 6 (167.6 cm) Weight: 122.5 kg BMI (Calculated): 43.6   INTAKE/OUTPUT:  No intake/output data recorded.  PHYSICAL EXAM:  Physical Exam Constitutional:      Appearance: She is well-developed.  HENT:     Head: Normocephalic and atraumatic.   Cardiovascular:     Rate and Rhythm: Normal rate and regular rhythm.  Pulmonary:     Effort: Pulmonary effort is normal.     Breath sounds: Normal breath sounds.  Abdominal:     General: Bowel sounds are decreased. There  is distension.     Palpations: Abdomen is soft.     Tenderness: There is abdominal tenderness in the periumbilical area.     Hernia: A hernia is present. Hernia is present in the ventral area.   Neurological:     Mental Status: She is alert.      Labs:     Latest Ref Rng & Units 05/17/2024    6:33 AM 03/24/2023    3:42 PM 03/21/2022   11:03 AM  CBC  WBC 4.0 - 10.5 K/uL 14.8  8.8  7.9   Hemoglobin 12.0 - 15.0 g/dL 82.7  85.6  85.0   Hematocrit 36.0 - 46.0 % 48.5  41.5  43.9   Platelets 150 - 400 K/uL 296  240.0  267.0       Latest Ref Rng & Units 05/17/2024    6:33 AM 03/24/2023    3:42 PM 03/21/2022   11:03 AM  CMP  Glucose 70 - 99 mg/dL 853  897  895   BUN 6 - 20 mg/dL 13  9  13    Creatinine 0.44 - 1.00 mg/dL 9.33  9.35  9.30   Sodium 135 - 145 mmol/L 136  138  139   Potassium 3.5 - 5.1 mmol/L 3.9  3.8  4.3   Chloride 98 - 111 mmol/L 105  103  102   CO2 22 - 32 mmol/L 23  28  29    Calcium 8.9 - 10.3 mg/dL 9.7  9.0  9.1   Total Protein 6.5 - 8.1 g/dL 7.8  6.8  7.3   Total Bilirubin 0.0 - 1.2 mg/dL 1.5  0.7  1.3   Alkaline Phos 38 - 126 U/L 82  79  86   AST 15 - 41 U/L 23  36  57   ALT 0 - 44 U/L 24  43  53      Imaging studies:   CLINICAL DATA:  Epigastric pain. Lower abdominal pressure and nausea and vomiting.    EXAM: 05/17/24 CT ABDOMEN AND PELVIS WITH CONTRAST   TECHNIQUE: Multidetector CT imaging of the abdomen and pelvis was performed using the standard protocol following bolus administration of intravenous contrast.   RADIATION DOSE REDUCTION: This exam was performed according to the departmental dose-optimization program which includes automated exposure control, adjustment of the mA and/or kV according to patient size and/or use of iterative reconstruction technique.   CONTRAST:  OMNIPAQUE  IOHEXOL  300 MG/ML  SOLN   COMPARISON:  03/06/2018   FINDINGS: Lower chest: No acute abnormality.   Hepatobiliary: No focal suspicious liver abnormality. Status post cholecystectomy. Mild fusiform dilatation of the common bile duct measures up to 8 mm, image 51/5. This is similar to the previous exam.   Pancreas: Unremarkable. No pancreatic ductal dilatation or surrounding inflammatory changes.   Spleen: Normal in size without focal abnormality.   Adrenals/Urinary Tract: Normal adrenal glands. Bilateral lower pole kidney stones. The largest stone is in the inferior pole of the right kidney measuring 6 mm, image 47/2. No kidney mass or signs of obstructive uropathy. Urinary bladder appears normal.   Stomach/Bowel: Stomach appears normal. There is abnormal dilatation involving the proximal and mid small bowel loops. There is a large infraumbilical hernia which contains dilated loops of small bowel. Transition point to decreased caliber distal small bowel noted within the hernia, image 81/6 and image 71/2. normal appearance of the colon. Distal colonic diverticulosis without signs of acute diverticulitis.   Vascular/Lymphatic: Aortic atherosclerosis. No aneurysm. No signs  abdominopelvic adenopathy.   Reproductive: Status post hysterectomy.  No adnexal mass identified.   Other: Small volume of free fluid is noted within the abdomen. No focal fluid collections identified. No signs  of pneumoperitoneum.   Musculoskeletal: No acute or significant osseous findings.   IMPRESSION: 1. Examination is positive for small bowel obstruction secondary to large infraumbilical hernia which contains dilated loops of small bowel. Transition point to decreased caliber distal small bowel noted within the hernia. 2. Small volume of free fluid is noted within the abdomen. No focal fluid collections identified. 3. Bilateral nonobstructing kidney stones. 4. Distal colonic diverticulosis without signs of acute diverticulitis. 5.  Aortic Atherosclerosis (ICD10-I70.0).     Electronically Signed   By: Waddell Calk M.D.   On: 05/17/2024 10:32   Assessment/Plan: 53 y.o. female with small bowel obstruction secondary to adhesions vs infraumbilical hernia, complicated by pertinent comorbidities including hypertension, obesity hyperlipidemia, and hyperglycemia.    - Recommended conservative treatment with NG tube   - NPO to allow bowel rest   - Pain management and IV fluids   - DVT prophylaxis  Thank you for the opportunity to participate in this patient's care.   -- Gilmer Cea PA-C

## 2024-05-17 NOTE — ED Notes (Signed)
 See triage note  Presents with lower abd pressure and n/v  States she has had similar episodes in the past   This started yesterday  Afebrile   But woke up with n/v  around 5 am  Abd is soft but slightly tender

## 2024-05-17 NOTE — ED Notes (Signed)
Informed RN bed assigned 

## 2024-05-17 NOTE — ED Notes (Signed)
Admitting md in with pt. 

## 2024-05-17 NOTE — ED Provider Notes (Signed)
 [facility] Provider Note    Event Date/Time   First MD Initiated Contact with Patient 05/17/24 [id number]     (approximate)   History   Abdominal Pain   HPI  Ruth Pennington is a 53 y.o. female  with history of hypertension, hypokalemia,  and as listed in EMR presents to the emergency department for treatment and evaluation of vomiting since yesterday afternoon with epigastric pain/periumbilical that radiates into upper abdomen. Last ate around 8pm, but has been vomiting intermittently since. No diarrhea, no blood in vomit. No sick contacts or recent travel.  Similar symptoms earlier in the month. She saw her PCP and was prescribed Zofran .       Physical Exam   Triage Vital Signs: ED Triage Vitals  Encounter Vitals Group     BP 05/17/24 0631 116/85     Girls Systolic BP Percentile --      Girls Diastolic BP Percentile --      Boys Systolic BP Percentile --      Boys Diastolic BP Percentile --      Pulse Rate 05/17/24 0631 (!) 110     Resp 05/17/24 0631 18     Temp 05/17/24 0631 98.7 F (37.1 C)     Temp Source 05/17/24 0631 Oral     SpO2 05/17/24 0631 98 %     Weight 05/17/24 0629 270 lb (122.5 kg)     Height 05/17/24 0629 5' 6 (1.676 m)     Head Circumference --      Peak Flow --      Pain Score 05/17/24 0629 7     Pain Loc --      Pain Education --      Exclude from Growth Chart --     Most recent vital signs: Vitals:   05/17/24 1226 05/17/24 1313  BP: 122/77 (!) 102/49  Pulse: 78 81  Resp: 18 18  Temp: 98 F (36.7 C) 98.6 F (37 C)  SpO2: 98% 98%    General: Awake, no distress.  CV:  Good peripheral perfusion.  Resp:  Normal effort.  Abd:  No distention.  Other:  Epigastric/periumbilical with reported pressure sensation.   ED Results / Procedures / Treatments   Labs (all labs ordered are listed, but only abnormal results are displayed) Labs Reviewed  COMPREHENSIVE METABOLIC PANEL WITH GFR - Abnormal; Notable for the  following components:      Result Value   Glucose, Bld 146 (*)    Total Bilirubin 1.5 (*)    All other components within normal limits  CBC - Abnormal; Notable for the following components:   WBC 14.8 (*)    RBC 5.52 (*)    Hemoglobin 17.2 (*)    HCT 48.5 (*)    All other components within normal limits  URINALYSIS, ROUTINE W REFLEX MICROSCOPIC - Abnormal; Notable for the following components:   Color, Urine AMBER (*)    APPearance CLOUDY (*)    Hgb urine dipstick SMALL (*)    Protein, ur 30 (*)    Leukocytes,Ua TRACE (*)    Bacteria, UA RARE (*)    All other components within normal limits  RESP PANEL BY RT-PCR (RSV, FLU A&B, COVID)  RVPGX2  LIPASE, BLOOD  LACTIC ACID, PLASMA  LACTIC ACID, PLASMA  HIV ANTIBODY (ROUTINE TESTING W REFLEX)  HEMOGLOBIN A1C  TROPONIN I (HIGH SENSITIVITY)  TROPONIN I (HIGH SENSITIVITY)     EKG  Not indicated.   RADIOLOGY  Image and radiology report reviewed and interpreted by me. Radiology report consistent with the same.  SBO secondary to large infraumbilical hernia containing dilated loops of small bowel.  Transition point to decreased caliber distal small bowel noted within the hernia.  PROCEDURES:  Critical Care performed: No  Procedures   MEDICATIONS ORDERED IN ED:  Medications  benzocaine (HURRICAINE) 20 % mouth spray (has no administration in time range)  lactated ringers  infusion (has no administration in time range)  acetaminophen  (TYLENOL ) tablet 650 mg (has no administration in time range)    Or  acetaminophen  (TYLENOL ) suppository 650 mg (has no administration in time range)  oxyCODONE (Oxy IR/ROXICODONE) immediate release tablet 5-10 mg (has no administration in time range)  morphine  (PF) 2 MG/ML injection 2 mg (has no administration in time range)  ondansetron  (ZOFRAN -ODT) disintegrating tablet 4 mg (has no administration in time range)    Or  ondansetron  (ZOFRAN ) injection 4 mg (has no administration in time  range)  hydrALAZINE (APRESOLINE) injection 10 mg (has no administration in time range)  sodium chloride  0.9 % bolus 1,000 mL (0 mLs Intravenous Stopped 05/17/24 1245)  ondansetron  (ZOFRAN ) injection 4 mg (4 mg Intravenous Given 05/17/24 0819)  iohexol  (OMNIPAQUE ) 300 MG/ML solution 100 mL (100 mLs Intravenous Contrast Given 05/17/24 0916)     IMPRESSION / MDM / ASSESSMENT AND PLAN / ED COURSE   I have reviewed the triage note.  Differential diagnosis includes, but is not limited to, choledocholithiasis, colitis, ileus, small bowel obstruction  Patient's presentation is most consistent with acute presentation with potential threat to life or bodily function.  53 year old female presents to the ER for evaluation of abdominal pressure and vomiting. See HPI.  Vital signs are stable.   On exam, she has mild tenderness/pressure above umbilicus/epigastric area.   Labs show a mild leukocytosis, but otherwise reassuring. Plan will be to get a CT abdomen and pelvis with contrast. Patient aware and agreeable to the plan.  Clinical Course as of 05/17/24 1456  Mon May 17, 2024  1036 Patient updated on results of respiratory panel, lab results, and urinalysis. She again declines urinary symptoms. Bacteria, WBC, and leukocytes likely due to contamination. Awaiting results of CT. [CT]  1148 Consulted with Dr. Cesar with general surgery. Plan will be to admit to medicine, order NG tube and continue fluids.   Results and plan discussed with patient and family.  Hospitalist has accepted patient for admission. [CT]    Clinical Course User Index [CT] Bowman Higbie B, FNP     FINAL CLINICAL IMPRESSION(S) / ED DIAGNOSES   Final diagnoses:  Small bowel obstruction (HCC)     Rx / DC Orders   ED Discharge Orders     None        Note:  This document was prepared using Dragon voice recognition software and may include unintentional dictation errors.   Ruth Kirk NOVAK, FNP 05/17/24  1456    Ruth Ozell LABOR, MD 05/19/24 0130

## 2024-05-18 ENCOUNTER — Inpatient Hospital Stay

## 2024-05-18 DIAGNOSIS — I10 Essential (primary) hypertension: Secondary | ICD-10-CM | POA: Diagnosis not present

## 2024-05-18 DIAGNOSIS — E66813 Obesity, class 3: Secondary | ICD-10-CM

## 2024-05-18 DIAGNOSIS — R739 Hyperglycemia, unspecified: Secondary | ICD-10-CM

## 2024-05-18 DIAGNOSIS — K56609 Unspecified intestinal obstruction, unspecified as to partial versus complete obstruction: Secondary | ICD-10-CM | POA: Diagnosis not present

## 2024-05-18 DIAGNOSIS — Z8542 Personal history of malignant neoplasm of other parts of uterus: Secondary | ICD-10-CM

## 2024-05-18 LAB — BASIC METABOLIC PANEL WITH GFR
Anion gap: 7 (ref 5–15)
BUN: 11 mg/dL (ref 6–20)
CO2: 28 mmol/L (ref 22–32)
Calcium: 8.7 mg/dL — ABNORMAL LOW (ref 8.9–10.3)
Chloride: 110 mmol/L (ref 98–111)
Creatinine, Ser: 0.67 mg/dL (ref 0.44–1.00)
GFR, Estimated: >60 mL/min (ref >60)
Glucose, Bld: 102 mg/dL — ABNORMAL HIGH (ref 70–99)
Potassium: 3.8 mmol/L (ref 3.5–5.1)
Sodium: 141 mmol/L (ref 135–145)

## 2024-05-18 LAB — CBC
HCT: 42.8 % (ref 36.0–46.0)
Hemoglobin: 14.6 g/dL (ref 12.0–15.0)
MCH: 30.9 pg (ref 26.0–34.0)
MCHC: 34.1 g/dL (ref 30.0–36.0)
MCV: 90.7 fL (ref 80.0–100.0)
Platelets: 224 10*3/uL (ref 150–400)
RBC: 4.72 MIL/uL (ref 3.87–5.11)
RDW: 13 % (ref 11.5–15.5)
WBC: 8.2 10*3/uL (ref 4.0–10.5)
nRBC: 0 % (ref 0.0–0.2)

## 2024-05-18 LAB — HEMOGLOBIN A1C
Hgb A1c MFr Bld: 4.4 % — ABNORMAL LOW (ref 4.8–5.6)
Mean Plasma Glucose: 79.58 mg/dL

## 2024-05-18 NOTE — Assessment & Plan Note (Signed)
 Estimated body mass index is 43.58 kg/m as calculated from the following:   Height as of this encounter: 5' 6 (1.676 m).   Weight as of this encounter: 122.5 kg.   -That will complicate overall prognosis - Encouraged weight loss

## 2024-05-18 NOTE — Plan of Care (Signed)

## 2024-05-18 NOTE — Progress Notes (Signed)
 [facility]- General Surgery  SURGICAL PROGRESS NOTE  Hospital Day(s): 1.   Interval History:  Patient seen and examined.She admits to feeling better. Patient reports having a bowel movement yesterday evening. Gastrografin was given last night. Denies any nausea or vomiting. Denies any fevers or chills.  NG tube output this morning was around 450 mL.    Vital signs in last 24 hours: [min-max] current  Temp:  [97.8 F (36.6 C)-99.3 F (37.4 C)] 97.8 F (36.6 C) (06/24 0743) Pulse Rate:  [78-88] 79 (06/24 0743) Resp:  [16-18] 16 (06/24 0743) BP: (101-142)/(7-85) 101/66 (06/24 0743) SpO2:  [96 %-98 %] 97 % (06/24 0743)     Height: 5' 6 (167.6 cm) Weight: 122.5 kg BMI (Calculated): 43.6   Intake/Output last 2 shifts:  No intake/output data recorded.   Physical Exam:  Constitutional: alert, cooperative and no distress  Respiratory: breathing non-labored at rest  Cardiovascular: regular rate and sinus rhythm  Gastrointestinal: soft, tenderness upon palpation near umbilicus and non-distended  Labs:     Latest Ref Rng & Units 05/18/2024    4:46 AM 05/17/2024    6:33 AM 03/24/2023    3:42 PM  CBC  WBC 4.0 - 10.5 K/uL 8.2  14.8  8.8   Hemoglobin 12.0 - 15.0 g/dL 85.3  82.7  85.6   Hematocrit 36.0 - 46.0 % 42.8  48.5  41.5   Platelets 150 - 400 K/uL 224  296  240.0       Latest Ref Rng & Units 05/18/2024    4:46 AM 05/17/2024    6:33 AM 03/24/2023    3:42 PM  CMP  Glucose 70 - 99 mg/dL 897  853  897   BUN 6 - 20 mg/dL 11  13  9    Creatinine 0.44 - 1.00 mg/dL 9.32  9.33  9.35   Sodium 135 - 145 mmol/L 141  136  138   Potassium 3.5 - 5.1 mmol/L 3.8  3.9  3.8   Chloride 98 - 111 mmol/L 110  105  103   CO2 22 - 32 mmol/L 28  23  28    Calcium 8.9 - 10.3 mg/dL 8.7  9.7  9.0   Total Protein 6.5 - 8.1 g/dL  7.8  6.8   Total Bilirubin 0.0 - 1.2 mg/dL  1.5  0.7   Alkaline Phos 38 - 126 U/L  82  79   AST 15 - 41 U/L  23  36   ALT 0 - 44 U/L  24  43      Imaging studies:   Report for Small bowel Obstruction Protocol-initial 8 hr delay not back yet. Images show gastrografin throughout colon.    Assessment/Plan:  53 y.o. female with small bowel obstruction likely due to adhesions pertinent comorbidities including hypertension, obesity hyperlipidemia, infraumbilical hernia and hyperglycemia.    - No fever, not tachycardiac and leukocytosis resolved, 14.8 >> 8.2    - Normal Hbg 17.2>> 14.6 was likely elevated due to dehydration   - Abdominal x-ray from 8hr study post-gastrografin show contrast in colon. Also clinically improving with patient having bowel movement. All reassuring for resolving SBO  - Clamped NG tube and start clear liquid diet   - Continue DVT prophylaxis   - Continue pain management   - Encouraged patient to ambulate   -- Zamiya Dillard Barrientos PA-C

## 2024-05-18 NOTE — Assessment & Plan Note (Addendum)
 Patient with history of multiple abdominal surgeries.  Started having bowel movement and Gastrografin studies with contrast in colon.  Symptoms improving. NG tube was clamped Patient was started on clear liquid diet General Surgery is on board-appreciate their help Encourage ambulation Continue to monitor

## 2024-05-18 NOTE — Hospital Course (Addendum)
 Taken from H&P.  LYLEE CORROW is a 53 y.o. female with medical history significant of endometrial CA (2016), HTN, and morbid obesity who presented on 6/23 with abdominal pain for 1 day associated with nausea and vomiting.  History of multiple abdominal surgeries.  On presentation vital stable, labs with leukocytosis at 14.8.  CT abdomen with concern of SBO.  General surgery was consulted and NG tube was placed.  6/24: Vital stable, leukocytosis resolved.  Patient had a bowel movement and Gastrografin studies with contrast in colon.  Surgery clamp the NG tube and started on clear liquid diet today.  6/25: Remained hemodynamically stable continue to have bowel movement and tolerating advancement in diet.  No more pain, nausea or vomiting.  NG tube was removed earlier today.  Patient with history of multiple similar episodes and will remain high risk for small bowel obstruction based on her history of multiple abdominal procedures and hernia.  Patient was instructed to keep herself well-hydrated and avoid constipation.  If she develops any worsening of symptoms she will need to seek medical assistance.  She will continue with her home medications and need to have a close follow-up with her providers for further assistance.

## 2024-05-18 NOTE — Assessment & Plan Note (Signed)
 Patient had 1 reading of mild hyperglycemia so A1c was checked and it was 4.4. Patient is not diabetic

## 2024-05-18 NOTE — Progress Notes (Signed)
   05/18/24 0900  Spiritual Encounters  Type of Visit Initial  Care provided to: Pt and family  Conversation partners present during encounter Other (comment) Training and development officer)  Reason for visit Advance directives  OnCall Visit No   Chaplain visited patient because of an  in the EPIC system that said patient wanted an AD.  Chaplain provided paperwork and explained it to the patient and her spouse.  Patient and spouse shared a bit about the medical journey and frustration about the circumstance they are in.  Patient was able to share the bright side that the effort being made now is working so [employer] celebrated that with them.  Patient and spouse said they attend [facility] and would love to see Fr. Vincent or Fr. Albert.  Chaplain shared she'd call and ask the [facility] for a visit.  Chaplain did call and the Secretary said Fr. Jerrell would be the one to get the message that a member would like a visit and prayer.  Chaplain offered prayer to the patient and spouse and they received it.  Chaplain shared they can ask the Nurse to Call the Chaplain if there are any other concerns.    Rev. Rana M. Nicholaus, M.Div. Chaplain Resident [facility]

## 2024-05-18 NOTE — Progress Notes (Signed)
  Progress Note   Patient: Ruth Pennington FMW:[id number] DOB: 09-21-1971 DOA: 05/17/2024     1 DOS: the patient was seen and examined on 05/18/2024   Brief hospital course: Taken from H&P.  Ruth Pennington is a 53 y.o. female with medical history significant of endometrial CA (2016), HTN, and morbid obesity who presented on 6/23 with abdominal pain for 1 day associated with nausea and vomiting.  History of multiple abdominal surgeries.  On presentation vital stable, labs with leukocytosis at 14.8.  CT abdomen with concern of SBO.  General surgery was consulted and NG tube was placed.  6/24: Vital stable, leukocytosis resolved.  Patient had a bowel movement and Gastrografin studies with contrast in colon.  Surgery clamp the NG tube and started on clear liquid diet today.  Assessment and Plan: * SBO (small bowel obstruction) (HCC) Patient with history of multiple abdominal surgeries.  Started having bowel movement and Gastrografin studies with contrast in colon.  Symptoms improving. NG tube was clamped Patient was started on clear liquid diet General Surgery is on board-appreciate their help Encourage ambulation Continue to monitor  Essential hypertension Blood pressure currently within goal. -Patient was not taking any antihypertensives at home -Continue to monitor  Hyperglycemia Patient had 1 reading of mild hyperglycemia so A1c was checked and it was 4.4. Patient is not diabetic  History of endometrial cancer Patient has hysterectomy in 2015 and bilateral oophorectomy in 2019. No acute concern. -Outpatient follow-up  Obesity, Class III, BMI 40-49.9 (morbid obesity) Estimated body mass index is 43.58 kg/m as calculated from the following:   Height as of this encounter: 5' 6 (1.676 m).   Weight as of this encounter: 122.5 kg.   -That will complicate overall prognosis - Encouraged weight loss   Subjective: Patient denies any more abdominal pain, nausea or vomiting.  Had  2 bowel movements.  Passing flatus.  Tolerated clear liquid diet earlier.  Physical Exam: Vitals:   05/17/24 2059 05/18/24 0400 05/18/24 0743 05/18/24 1435  BP: (!) 102/51 117/66 101/66 113/68  Pulse: 79 88 79 70  Resp: 16 16 16 16   Temp: 98.6 F (37 C) 98.2 F (36.8 C) 97.8 F (36.6 C) 99 F (37.2 C)  TempSrc:   Oral   SpO2: 96% 96% 97% 98%  Weight:      Height:       General.  Morbidly obese lady, in no acute distress. Pulmonary.  Lungs clear bilaterally, normal respiratory effort. CV.  Regular rate and rhythm, no JVD, rub or murmur. Abdomen.  Soft, nontender, nondistended, BS positive. CNS.  Alert and oriented .  No focal neurologic deficit. Extremities.  No edema, no cyanosis, pulses intact and symmetrical. Psychiatry.  Judgment and insight appears normal.   Data Reviewed: Prior data reviewed  Family Communication: Discussed with husband at bedside  Disposition: Status is: Inpatient Remains inpatient appropriate because: Severity of illness  Planned Discharge Destination: Home  Time spent: 45 minutes  This record has been created using Conservation officer, historic buildings. Errors have been sought and corrected,but may not always be located. Such creation errors do not reflect on the standard of care.   Author: Amaryllis Dare, MD 05/18/2024 2:38 PM  For on call review [email or web address].

## 2024-05-18 NOTE — Assessment & Plan Note (Signed)
 Patient has hysterectomy in 2015 and bilateral oophorectomy in 2019. No acute concern. -Outpatient follow-up

## 2024-05-18 NOTE — Assessment & Plan Note (Signed)
 Blood pressure currently within goal. -Patient was not taking any antihypertensives at home -Continue to monitor

## 2024-05-19 DIAGNOSIS — Z8542 Personal history of malignant neoplasm of other parts of uterus: Secondary | ICD-10-CM | POA: Diagnosis not present

## 2024-05-19 DIAGNOSIS — K56609 Unspecified intestinal obstruction, unspecified as to partial versus complete obstruction: Secondary | ICD-10-CM | POA: Diagnosis not present

## 2024-05-19 DIAGNOSIS — R739 Hyperglycemia, unspecified: Secondary | ICD-10-CM | POA: Diagnosis not present

## 2024-05-19 DIAGNOSIS — I10 Essential (primary) hypertension: Secondary | ICD-10-CM | POA: Diagnosis not present

## 2024-05-19 MED ORDER — ONDANSETRON 4 MG PO TBDP: 4.0000 mg | ORAL_TABLET | Freq: Four times a day (QID) | ORAL | 0 refills | Status: DC | PRN

## 2024-05-19 NOTE — Progress Notes (Signed)
 [facility]- General Surgery  SURGICAL PROGRESS NOTE  Hospital Day(s): 2.   Interval History:  Patient seen and examined No acute events or new complaints overnight. States she slept well last night. Patient reports tolerating clear liquid diet. Admits to having small bowel movements and passing gas. Denies any nausea or vomiting.    Vital signs in last 24 hours: [min-max] current  Temp:  [98.2 F (36.8 C)-99 F (37.2 C)] 98.3 F (36.8 C) (06/25 0354) Pulse Rate:  [70-75] 74 (06/25 0354) Resp:  [15-18] 18 (06/25 0354) BP: (111-123)/(68-71) 111/69 (06/25 0354) SpO2:  [96 %-98 %] 96 % (06/25 0354)     Height: 5' 6 (167.6 cm) Weight: 122.5 kg BMI (Calculated): 43.6   Intake/Output last 2 shifts:  06/24 0701 - 06/25 0700 In: 120 [P.O.:120] Out: -    Physical Exam:  Constitutional: alert, cooperative and no distress  Respiratory: breathing non-labored at rest  Cardiovascular: regular rate and sinus rhythm  Gastrointestinal: soft, non-tender, and less distended   Labs:     Latest Ref Rng & Units 05/18/2024    4:46 AM 05/17/2024    6:33 AM 03/24/2023    3:42 PM  CBC  WBC 4.0 - 10.5 K/uL 8.2  14.8  8.8   Hemoglobin 12.0 - 15.0 g/dL 85.3  82.7  85.6   Hematocrit 36.0 - 46.0 % 42.8  48.5  41.5   Platelets 150 - 400 K/uL 224  296  240.0       Latest Ref Rng & Units 05/18/2024    4:46 AM 05/17/2024    6:33 AM 03/24/2023    3:42 PM  CMP  Glucose 70 - 99 mg/dL 897  853  897   BUN 6 - 20 mg/dL 11  13  9    Creatinine 0.44 - 1.00 mg/dL 9.32  9.33  9.35   Sodium 135 - 145 mmol/L 141  136  138   Potassium 3.5 - 5.1 mmol/L 3.8  3.9  3.8   Chloride 98 - 111 mmol/L 110  105  103   CO2 22 - 32 mmol/L 28  23  28    Calcium 8.9 - 10.3 mg/dL 8.7  9.7  9.0   Total Protein 6.5 - 8.1 g/dL  7.8  6.8   Total Bilirubin 0.0 - 1.2 mg/dL  1.5  0.7   Alkaline Phos 38 - 126 U/L  82  79   AST 15 - 41 U/L  23  36   ALT 0 - 44 U/L  24  43     Imaging studies: No new pertinent imaging  studies   Assessment/Plan:  53 y.o. female with small bowel obstruction likely due to adhesions pertinent comorbidities including hypertension, obesity hyperlipidemia, infraumbilical hernia and hyperglycemia.    - No fever and not tachycardiac  - Removed NG tube    - Advanced to full liquid diet since patient tolerated clear liquids. May advance to soft diet for dinner if tolerates full liquids for breakfast and lunch   - Continue pain management   - Continue DVT prophylaxis and encourage to ambulate  -- Nikea Settle Barrientos PA-C

## 2024-05-19 NOTE — Discharge Instructions (Signed)
  Diet: Resume home heart healthy regular diet.   Activity: No heavy lifting >20 pounds (children, pets, laundry, garbage) or strenuous activity until follow-up, but light activity and walking are encouraged. Do not drive or drink alcohol if taking narcotic pain medications.  Medications: Resume all home medications. For mild to moderate pain: acetaminophen  (Tylenol ) or ibuprofen (if no kidney disease). Combining Tylenol  with alcohol can substantially increase your risk of causing liver disease. Narcotic pain medications, if prescribed, can be used for severe pain, though may cause nausea, constipation, and drowsiness. Do not combine Tylenol  and Norco within a 6 hour period as Norco contains Tylenol . If you do not need the narcotic pain medication, you do not need to fill the prescription.  Call office [phone]) at any time if any questions, worsening pain, fevers/chills, bleeding, drainage from incision site, or other concerns.

## 2024-05-19 NOTE — Plan of Care (Signed)
   Problem: Education: Goal: Knowledge of General Education information will improve Description Including pain rating scale, medication(s)/side effects and non-pharmacologic comfort measures Outcome: Progressing   Problem: Health Behavior/Discharge Planning: Goal: Ability to manage health-related needs will improve Outcome: Progressing

## 2024-05-19 NOTE — Discharge Summary (Signed)
 Physician Discharge Summary   Patient: Ruth Pennington MRN: [id number] DOB: Mar 22, 1971  Admit date:     05/17/2024  Discharge date: 05/19/24  Discharge Physician: Amaryllis Dare   PCP: Gretel App, NP   Recommendations at discharge:  Please obtain CBC and BMP on follow-up Follow-up with primary care provider Follow-up with general surgery  Discharge Diagnoses: Principal Problem:   SBO (small bowel obstruction) (HCC) Active Problems:   Essential hypertension   Hyperglycemia   History of endometrial cancer   Obesity, Class III, BMI 40-49.9 (morbid obesity)   Hyperlipidemia   Hospital Course: Taken from H&P.  Ruth Pennington is a 53 y.o. female with medical history significant of endometrial CA (2016), HTN, and morbid obesity who presented on 6/23 with abdominal pain for 1 day associated with nausea and vomiting.  History of multiple abdominal surgeries.  On presentation vital stable, labs with leukocytosis at 14.8.  CT abdomen with concern of SBO.  General surgery was consulted and NG tube was placed.  6/24: Vital stable, leukocytosis resolved.  Patient had a bowel movement and Gastrografin studies with contrast in colon.  Surgery clamp the NG tube and started on clear liquid diet today.  6/25: Remained hemodynamically stable continue to have bowel movement and tolerating advancement in diet.  No more pain, nausea or vomiting.  NG tube was removed earlier today.  Patient with history of multiple similar episodes and will remain high risk for small bowel obstruction based on her history of multiple abdominal procedures and hernia.  Patient was instructed to keep herself well-hydrated and avoid constipation.  If she develops any worsening of symptoms she will need to seek medical assistance.  She will continue with her home medications and need to have a close follow-up with her providers for further assistance.  Assessment and Plan: * SBO (small bowel obstruction)  (HCC) Patient with history of multiple abdominal surgeries.  Started having bowel movement and Gastrografin studies with contrast in colon.  Symptoms resolved, NG tube removed and patient was tolerating advancement in diet and having bowel movement.  Essential hypertension Blood pressure currently within goal. -Patient was not taking any antihypertensives at home -Continue to monitor  Hyperglycemia Patient had 1 reading of mild hyperglycemia so A1c was checked and it was 4.4. Patient is not diabetic  History of endometrial cancer Patient has hysterectomy in 2015 and bilateral oophorectomy in 2019. No acute concern. -Outpatient follow-up  Obesity, Class III, BMI 40-49.9 (morbid obesity) Estimated body mass index is 43.58 kg/m as calculated from the following:   Height as of this encounter: 5' 6 (1.676 m).   Weight as of this encounter: 122.5 kg.   -That will complicate overall prognosis - Encouraged weight loss  Consultants: General Surgery Procedures performed: None Disposition: Home Diet recommendation:  Discharge Diet Orders (From admission, onward)     Start     Ordered   05/19/24 0000  Diet - low sodium heart healthy        05/19/24 1039           Regular diet DISCHARGE MEDICATION: Allergies as of 05/19/2024   No Known Allergies      Medication List     STOP taking these medications    ibuprofen 200 MG tablet Commonly known as: ADVIL   Zepbound  10 MG/0.5ML Pen Generic drug: tirzepatide        TAKE these medications    acetaminophen  500 MG tablet Commonly known as: TYLENOL  Take by mouth every 6 (six) hours as  needed.   ketoconazole  2 % shampoo Commonly known as: NIZORAL  Apply 1 Application topically 2 (two) times a week.   ondansetron  4 MG disintegrating tablet Commonly known as: ZOFRAN -ODT Take 1 tablet (4 mg total) by mouth every 6 (six) hours as needed for nausea.   pantoprazole  20 MG tablet Commonly known as: Protonix  Take 1 tablet  (20 mg total) by mouth daily.        Follow-up Information     Gretel App, NP. Go on 06/03/2024.   Specialty: Nurse Practitioner Why: July 10 at 9:00 a.m. Contact information: [address] [place] [address] [phone]                Discharge Exam: Ruth Pennington   05/17/24 0629  Weight: 122.5 kg   General.  Morbidly obese lady, in no acute distress. Pulmonary.  Lungs clear bilaterally, normal respiratory effort. CV.  Regular rate and rhythm, no JVD, rub or murmur. Abdomen.  Soft, nontender, nondistended, BS positive. CNS.  Alert and oriented .  No focal neurologic deficit. Extremities.  No edema, no cyanosis, pulses intact and symmetrical. Psychiatry.  Judgment and insight appears normal.   Condition at discharge: stable  The results of significant diagnostics from this hospitalization (including imaging, microbiology, ancillary and laboratory) are listed below for reference.   Imaging Studies: DG Abd Portable 1V-Small Bowel Obstruction Protocol-initial, 8 hr delay Result Date: 05/18/2024 CLINICAL DATA:  Small bowel obstruction. EXAM: PORTABLE ABDOMEN - 1 VIEW COMPARISON:  May 17, 2024. FINDINGS: Distal tip of nasogastric tube is seen in expected position of distal stomach. Status post cholecystectomy. No abnormal bowel dilatation is noted. Residual contrast is seen throughout nondilated colon. IMPRESSION: Residual contrast seen throughout nondilated colon. Electronically Signed   By: Lynwood Landy Raddle M.D.   On: 05/18/2024 09:22   DG Abd 1 View Result Date: 05/17/2024 CLINICAL DATA:  NG tube EXAM: ABDOMEN - 1 VIEW COMPARISON:  None Available. FINDINGS: Nasogastric tube tip is at the level of the gastric antrum. IMPRESSION: Nasogastric tube tip is at the level of the gastric antrum. Electronically Signed   By: Greig Pique M.D.   On: 05/17/2024 17:29   CT ABDOMEN PELVIS W CONTRAST Result Date: 05/17/2024 CLINICAL DATA:  Epigastric pain. Lower  abdominal pressure and nausea and vomiting. EXAM: CT ABDOMEN AND PELVIS WITH CONTRAST TECHNIQUE: Multidetector CT imaging of the abdomen and pelvis was performed using the standard protocol following bolus administration of intravenous contrast. RADIATION DOSE REDUCTION: This exam was performed according to the departmental dose-optimization program which includes automated exposure control, adjustment of the mA and/or kV according to patient size and/or use of iterative reconstruction technique. CONTRAST:  OMNIPAQUE  IOHEXOL  300 MG/ML  SOLN COMPARISON:  03/06/2018 FINDINGS: Lower chest: No acute abnormality. Hepatobiliary: No focal suspicious liver abnormality. Status post cholecystectomy. Mild fusiform dilatation of the common bile duct measures up to 8 mm, image 51/5. This is similar to the previous exam. Pancreas: Unremarkable. No pancreatic ductal dilatation or surrounding inflammatory changes. Spleen: Normal in size without focal abnormality. Adrenals/Urinary Tract: Normal adrenal glands. Bilateral lower pole kidney stones. The largest stone is in the inferior pole of the right kidney measuring 6 mm, image 47/2. No kidney mass or signs of obstructive uropathy. Urinary bladder appears normal. Stomach/Bowel: Stomach appears normal. There is abnormal dilatation involving the proximal and mid small bowel loops. There is a large infraumbilical hernia which contains dilated loops of small bowel. Transition point to decreased caliber distal small bowel  noted within the hernia, image 81/6 and image 71/2. normal appearance of the colon. Distal colonic diverticulosis without signs of acute diverticulitis. Vascular/Lymphatic: Aortic atherosclerosis. No aneurysm. No signs abdominopelvic adenopathy. Reproductive: Status post hysterectomy.  No adnexal mass identified. Other: Small volume of free fluid is noted within the abdomen. No focal fluid collections identified. No signs of pneumoperitoneum. Musculoskeletal: No  acute or significant osseous findings. IMPRESSION: 1. Examination is positive for small bowel obstruction secondary to large infraumbilical hernia which contains dilated loops of small bowel. Transition point to decreased caliber distal small bowel noted within the hernia. 2. Small volume of free fluid is noted within the abdomen. No focal fluid collections identified. 3. Bilateral nonobstructing kidney stones. 4. Distal colonic diverticulosis without signs of acute diverticulitis. 5.  Aortic Atherosclerosis (ICD10-I70.0). Electronically Signed   By: Waddell Calk M.D.   On: 05/17/2024 10:32    Microbiology: Results for orders placed or performed during the hospital encounter of 05/17/24  Resp panel by RT-PCR (RSV, Flu A&B, Covid) Anterior Nasal Swab     Status: None   Collection Time: 05/17/24  7:55 AM   Specimen: Anterior Nasal Swab  Result Value Ref Range Status   SARS Coronavirus 2 by RT PCR NEGATIVE NEGATIVE Final    Comment: (NOTE) SARS-CoV-2 target nucleic acids are NOT DETECTED.  The SARS-CoV-2 RNA is generally detectable in upper respiratory specimens during the acute phase of infection. The lowest concentration of SARS-CoV-2 viral copies this assay can detect is 138 copies/mL. A negative result does not preclude SARS-Cov-2 infection and should not be used as the sole basis for treatment or other patient management decisions. A negative result may occur with  improper specimen collection/handling, submission of specimen other than nasopharyngeal swab, presence of viral mutation(s) within the areas targeted by this assay, and inadequate number of viral copies(<138 copies/mL). A negative result must be combined with clinical observations, patient history, and epidemiological information. The expected result is Negative.  Fact Sheet for Patients:  BloggerCourse.com  Fact Sheet for Healthcare Providers:  SeriousBroker.it  This  test is no t yet approved or cleared by the [place]  FDA and  has been authorized for detection and/or diagnosis of SARS-CoV-2 by FDA under an Emergency Use Authorization (EUA). This EUA will remain  in effect (meaning this test can be used) for the duration of the COVID-19 declaration under Section 564(b)(1) of the Act, 21 U.S.C.section 360bbb-3(b)(1), unless the authorization is terminated  or revoked sooner.       Influenza A by PCR NEGATIVE NEGATIVE Final   Influenza B by PCR NEGATIVE NEGATIVE Final    Comment: (NOTE) The Xpert Xpress SARS-CoV-2/FLU/RSV plus assay is intended as an aid in the diagnosis of influenza from Nasopharyngeal swab specimens and should not be used as a sole basis for treatment. Nasal washings and aspirates are unacceptable for Xpert Xpress SARS-CoV-2/FLU/RSV testing.  Fact Sheet for Patients: BloggerCourse.com  Fact Sheet for Healthcare Providers: SeriousBroker.it  This test is not yet approved or cleared by the [place]  FDA and has been authorized for detection and/or diagnosis of SARS-CoV-2 by FDA under an Emergency Use Authorization (EUA). This EUA will remain in effect (meaning this test can be used) for the duration of the COVID-19 declaration under Section 564(b)(1) of the Act, 21 U.S.C. section 360bbb-3(b)(1), unless the authorization is terminated or revoked.     Resp Syncytial Virus by PCR NEGATIVE NEGATIVE Final    Comment: (NOTE) Fact Sheet for Patients: BloggerCourse.com  Fact Sheet for  Healthcare Providers: SeriousBroker.it  This test is not yet approved or cleared by the [place]  FDA and has been authorized for detection and/or diagnosis of SARS-CoV-2 by FDA under an Emergency Use Authorization (EUA). This EUA will remain in effect (meaning this test can be used) for the duration of the COVID-19 declaration under  Section 564(b)(1) of the Act, 21 U.S.C. section 360bbb-3(b)(1), unless the authorization is terminated or revoked.  Performed at [facility], [address] [place], [place] [address]     Labs: CBC: Recent Labs  Lab 05/17/24 [phone] 05/18/24 0446  WBC 14.8* 8.2  HGB 17.2* 14.6  HCT 48.5* 42.8  MCV 87.9 90.7  PLT 296 224   Basic Metabolic Panel: Recent Labs  Lab 05/17/24 0633 05/18/24 0446  NA 136 141  K 3.9 3.8  CL 105 110  CO2 23 28  GLUCOSE 146* 102*  BUN 13 11  CREATININE 0.66 0.67  CALCIUM 9.7 8.7*   Liver Function Tests: Recent Labs  Lab 05/17/24 0633  AST 23  ALT 24  ALKPHOS 82  BILITOT 1.5*  PROT 7.8  ALBUMIN 3.9   CBG: No results for input(s): GLUCAP in the last 168 hours.  Discharge time spent: greater than 30 minutes.  This record has been created using Conservation officer, historic buildings. Errors have been sought and corrected,but may not always be located. Such creation errors do not reflect on the standard of care.   Signed: Amaryllis Dare, MD [facility] 05/19/2024

## 2024-05-19 NOTE — Progress Notes (Signed)
 Patient is being discharged home. All discharge instructions reviewed including new medications and follow up appointments. Patient verbalized a full understanding of all instructions. Patient husband is her ride home.

## 2024-05-19 NOTE — TOC CM/SW Note (Signed)
 Transition of Care [facility]) - Inpatient Brief Assessment   Patient Details  Name: BEREA MAJKOWSKI MRN: [id number] Date of Birth: 1971-10-04  Transition of Care [facility]) CM/SW Contact:    Lauraine JAYSON Carpen, LCSW Phone Number: 05/19/2024, 9:50 AM   Clinical Narrative: CSW reviewed chart. No TOC needs identified so far. CSW will continue to follow progress. Please place [facility] consult if any needs arise.  Transition of Care Asessment: Insurance and Status: Insurance coverage has been reviewed Patient has primary care physician: Yes Home environment has been reviewed: Single family home Prior level of function:: Not documented Prior/Current Home Services: No current home services Social Drivers of Health Review: SDOH reviewed no interventions necessary Readmission risk has been reviewed: Yes Transition of care needs: no transition of care needs at this time

## 2024-05-20 ENCOUNTER — Telehealth: Payer: Self-pay

## 2024-05-20 NOTE — Transitions of Care (Post Inpatient/ED Visit) (Signed)
   05/20/2024  Name: Ruth Pennington MRN: [id number] DOB: 05/16/1971  Today's TOC FU Call Status: Today's TOC FU Call Status:: Successful TOC FU Call Completed TOC FU Call Complete Date: 05/20/24 Patient's Name and Date of Birth confirmed.  Transition Care Management Follow-up Telephone Call Date of Discharge: 05/19/24 Discharge Facility: [facility]) Type of Discharge: Inpatient Admission Primary Inpatient Discharge Diagnosis:: intestinal obstruction How have you been since you were released from the hospital?: Better Any questions or concerns?: No  Items Reviewed: Did you receive and understand the discharge instructions provided?: Yes Medications obtained,verified, and reconciled?: Yes (Medications Reviewed) Any new allergies since your discharge?: No Dietary orders reviewed?: Yes Do you have support at home?: Yes People in Home [RPT]: spouse  Medications Reviewed Today: Medications Reviewed Today     Reviewed by Emmitt Pan, LPN (Licensed Practical Nurse) on 05/20/24 at 1047  Med List Status: <None>   Medication Order Taking? Sig Documenting Provider Last Dose Status Informant  acetaminophen  (TYLENOL ) 500 MG tablet [id number]  Take by mouth every 6 (six) hours as needed.   Patient not taking: Reported on 05/20/2024   [provider]  Active Self  ketoconazole  (NIZORAL ) 2 % shampoo [id number] Yes Apply 1 Application topically 2 (two) times a week. Gretel App, NP  Active Self  ondansetron  (ZOFRAN -ODT) 4 MG disintegrating tablet [id number] Yes Take 1 tablet (4 mg total) by mouth every 6 (six) hours as needed for nausea. Caleen Qualia, MD  Active   pantoprazole  (PROTONIX ) 20 MG tablet [id number]  Take 1 tablet (20 mg total) by mouth daily.  Patient not taking: Reported on 05/20/2024   Abbey Bruckner, MD  Active Self            Home Care and Equipment/Supplies: Were Home Health Services Ordered?: NA Any new equipment or medical  supplies ordered?: NA  Functional Questionnaire: Do you need assistance with bathing/showering or dressing?: No Do you need assistance with meal preparation?: No Do you need assistance with eating?: No Do you have difficulty maintaining continence: No Do you need assistance with getting out of bed/getting out of a chair/moving?: No Do you have difficulty managing or taking your medications?: No  Follow up appointments reviewed: PCP Follow-up appointment confirmed?: No (sent message to staff to schedule) MD Provider Line Number:[phone] Given: No Specialist Hospital Follow-up appointment confirmed?: Yes Date of Specialist follow-up appointment?: 06/01/24 Follow-Up Specialty Provider:: surgeon Do you need transportation to your follow-up appointment?: No Do you understand care options if your condition(s) worsen?: Yes-patient verbalized understanding    SIGNATURE Pan Emmitt, LPN [facility] Nurse Health Advisor Direct Dial [phone]

## 2024-05-26 NOTE — Telephone Encounter (Signed)
 Noted

## 2024-06-01 DIAGNOSIS — Z6841 Body Mass Index (BMI) 40.0 and over, adult: Secondary | ICD-10-CM | POA: Diagnosis not present

## 2024-06-01 DIAGNOSIS — K432 Incisional hernia without obstruction or gangrene: Secondary | ICD-10-CM | POA: Diagnosis not present

## 2024-06-03 ENCOUNTER — Other Ambulatory Visit: Payer: Self-pay | Admitting: Nurse Practitioner

## 2024-06-03 ENCOUNTER — Ambulatory Visit (INDEPENDENT_AMBULATORY_CARE_PROVIDER_SITE_OTHER): Admitting: Nurse Practitioner

## 2024-06-03 ENCOUNTER — Encounter: Payer: Self-pay | Admitting: Nurse Practitioner

## 2024-06-03 VITALS — BP 122/80 | HR 79 | Temp 98.4°F | Ht 66.0 in | Wt 269.4 lb

## 2024-06-03 DIAGNOSIS — E66813 Obesity, class 3: Secondary | ICD-10-CM | POA: Diagnosis not present

## 2024-06-03 DIAGNOSIS — K432 Incisional hernia without obstruction or gangrene: Secondary | ICD-10-CM

## 2024-06-03 DIAGNOSIS — K56609 Unspecified intestinal obstruction, unspecified as to partial versus complete obstruction: Secondary | ICD-10-CM

## 2024-06-03 DIAGNOSIS — E785 Hyperlipidemia, unspecified: Secondary | ICD-10-CM

## 2024-06-03 NOTE — Progress Notes (Signed)
 Leron Glance, NP-C Phone: [phone]  Ruth Pennington is a 53 y.o. female who presents today for follow up.   Discussed the use of AI scribe software for clinical note transcription with the patient, who gave verbal consent to proceed.  History of Present Illness   Ruth Pennington is a 53 year old female with a history of small bowel obstruction and hernia who presents for a follow-up visit after recent hospitalization.  She experienced intermittent vomiting and severe abdominal pain beginning in May, with episodes of vomiting occurring four to five times in one night. Initially, she suspected her symptoms were related to medication or a urinary tract infection (UTI), as she experienced a shock wave sensation after urination. A home UTI test was positive, but subsequent testing at urgent care was inconclusive. She was prescribed antibiotics, which she completed. Her symptoms persisted, leading to a hospital visit on June 23, where she was diagnosed with a small bowel obstruction, likely due to adhesions.  She has a history of hernia, which she suspects may be large based on previous imaging. She experiences discomfort during physical activities like leg presses. She is concerned about her weight and the risk of recurrence of hernia due to adhesions.  She has been using weight loss medications, specifically GLP-1 agonists, but faced insurance issues that led to discontinuation. She reports significant weight loss of approximately 80 pounds since October, but has experienced some weight regain after stopping the medication. She is exploring options for weight management, including potential bariatric surgery, but is concerned about the risk of adhesions from additional surgeries.  She is managing her diet carefully, avoiding foods that may exacerbate her symptoms, and is cautious about physical activity due to her hernia.      Social History   Tobacco Use  Smoking Status Never  Smokeless  Tobacco Never    Current Outpatient Medications on File Prior to Visit  Medication Sig Dispense Refill   acetaminophen  (TYLENOL ) 500 MG tablet Take by mouth every 6 (six) hours as needed.      ketoconazole  (NIZORAL ) 2 % shampoo Apply 1 Application topically 2 (two) times a week. 120 mL 5   ondansetron  (ZOFRAN -ODT) 4 MG disintegrating tablet Take 1 tablet (4 mg total) by mouth every 6 (six) hours as needed for nausea. 20 tablet 0   pantoprazole  (PROTONIX ) 20 MG tablet Take 1 tablet (20 mg total) by mouth daily. (Patient not taking: Reported on 06/03/2024) 30 tablet 0   No current facility-administered medications on file prior to visit.     ROS see history of present illness  Objective  Physical Exam Vitals:   06/03/24 0905  BP: 122/80  Pulse: 79  Temp: 98.4 F (36.9 C)  SpO2: 97%    BP Readings from Last 3 Encounters:  06/03/24 122/80  05/19/24 106/76  05/04/24 110/70   Wt Readings from Last 3 Encounters:  06/03/24 269 lb 6.4 oz (122.2 kg)  05/17/24 270 lb (122.5 kg)  05/04/24 270 lb 12.8 oz (122.8 kg)    Physical Exam Constitutional:      General: She is not in acute distress.    Appearance: Normal appearance. She is obese.  HENT:     Head: Normocephalic.  Cardiovascular:     Rate and Rhythm: Normal rate and regular rhythm.     Heart sounds: Normal heart sounds.  Pulmonary:     Effort: Pulmonary effort is normal.     Breath sounds: Normal breath sounds.  Skin:  General: Skin is warm and dry.  Neurological:     General: No focal deficit present.     Mental Status: She is alert.  Psychiatric:        Mood and Affect: Mood normal.        Behavior: Behavior normal.      Assessment/Plan: Please see individual problem list.  SBO (small bowel obstruction) (HCC) Assessment & Plan: Recurrent obstruction is likely due to adhesions and a large incisional hernia. Conservative management is in place. Hernia repair is advised after BMI reduction to improve  outcomes and reduce recurrence risk, which is high due to adhesions. Monitor for abdominal pain, nausea, and vomiting. Advise immediate hospital visit if symptoms occur. Emphasize weight loss to reduce recurrence risk and improve surgical outcomes. Hospital discharge summary, labs and imaging all reviewed today.    Obesity, Class III, BMI 40-49.9 (morbid obesity) Assessment & Plan: BMI reduction is needed for surgical intervention. Previous weight loss with GLP-1 agonists was regained post-discontinuation. Consider resuming GLP-1 therapy and explore bariatric surgery. Discussed pros and cons of bariatric surgery versus GLP-1 therapy. Prescribe Zepbound  (semaglutide ) 2.5 mg via Agricultural consultant for weight management. Encourage consultation with the bariatric surgery team for surgical options and comprehensive weight management. Advise on safe exercise practices, limiting weight lifting to 25 pounds and focusing on non-strenuous activities. Encourage healthy diet.   Orders: -     Tirzepatide -Weight Management; Inject 2.5 mg into the skin once a week.  Dispense: 2 mL; Refill: 0  Incisional hernia, without obstruction or gangrene Assessment & Plan: A large hernia contributes to symptoms. Surgical repair is recommended post-weight loss to improve outcomes and reduce recurrence risk. Current weight increases recurrence risk if repaired now. Encourage weight loss for future hernia repair and discuss potential hernia repair with panniculectomy post-BMI reduction.      Return in about 3 months (around 09/03/2024) for Follow up.   Leron Glance, NP-C [facility]

## 2024-06-07 ENCOUNTER — Encounter: Payer: Self-pay | Admitting: Nurse Practitioner

## 2024-06-07 ENCOUNTER — Other Ambulatory Visit (INDEPENDENT_AMBULATORY_CARE_PROVIDER_SITE_OTHER)

## 2024-06-07 DIAGNOSIS — E785 Hyperlipidemia, unspecified: Secondary | ICD-10-CM

## 2024-06-07 LAB — LIPID PANEL
Cholesterol: 189 mg/dL (ref 0–200)
HDL: 41 mg/dL (ref >39.00)
LDL Cholesterol: 104 mg/dL — ABNORMAL HIGH (ref 0–99)
NonHDL: 148.18
Total CHOL/HDL Ratio: 5
Triglycerides: 221 mg/dL — ABNORMAL HIGH (ref 0.0–149.0)
VLDL: 44.2 mg/dL — ABNORMAL HIGH (ref 0.0–40.0)

## 2024-06-07 LAB — COMPREHENSIVE METABOLIC PANEL WITH GFR
ALT: 14 U/L (ref 0–35)
AST: 15 U/L (ref 0–37)
Albumin: 4 g/dL (ref 3.5–5.2)
Alkaline Phosphatase: 84 U/L (ref 39–117)
BUN: 15 mg/dL (ref 6–23)
CO2: 28 meq/L (ref 19–32)
Calcium: 9.5 mg/dL (ref 8.4–10.5)
Chloride: 103 meq/L (ref 96–112)
Creatinine, Ser: 0.67 mg/dL (ref 0.40–1.20)
GFR: 99.51 mL/min (ref >60.00)
Glucose, Bld: 96 mg/dL (ref 70–99)
Potassium: 3.9 meq/L (ref 3.5–5.1)
Sodium: 138 meq/L (ref 135–145)
Total Bilirubin: 0.6 mg/dL (ref 0.2–1.2)
Total Protein: 7 g/dL (ref 6.0–8.3)

## 2024-06-08 ENCOUNTER — Ambulatory Visit: Payer: Self-pay | Admitting: Nurse Practitioner

## 2024-06-08 MED ORDER — TIRZEPATIDE-WEIGHT MANAGEMENT 2.5 MG/0.5ML ~~LOC~~ SOLN
2.5000 mg | SUBCUTANEOUS | 0 refills | Status: DC
Start: 2024-06-08 — End: 2024-09-03

## 2024-06-11 ENCOUNTER — Encounter: Payer: Self-pay | Admitting: Nurse Practitioner

## 2024-06-11 NOTE — Assessment & Plan Note (Signed)
 A large hernia contributes to symptoms. Surgical repair is recommended post-weight loss to improve outcomes and reduce recurrence risk. Current weight increases recurrence risk if repaired now. Encourage weight loss for future hernia repair and discuss potential hernia repair with panniculectomy post-BMI reduction.

## 2024-06-11 NOTE — Assessment & Plan Note (Signed)
 BMI reduction is needed for surgical intervention. Previous weight loss with GLP-1 agonists was regained post-discontinuation. Consider resuming GLP-1 therapy and explore bariatric surgery. Discussed pros and cons of bariatric surgery versus GLP-1 therapy. Prescribe Zepbound  (semaglutide ) 2.5 mg via Agricultural consultant for weight management. Encourage consultation with the bariatric surgery team for surgical options and comprehensive weight management. Advise on safe exercise practices, limiting weight lifting to 25 pounds and focusing on non-strenuous activities. Encourage healthy diet.

## 2024-06-11 NOTE — Assessment & Plan Note (Signed)
 Recurrent obstruction is likely due to adhesions and a large incisional hernia. Conservative management is in place. Hernia repair is advised after BMI reduction to improve outcomes and reduce recurrence risk, which is high due to adhesions. Monitor for abdominal pain, nausea, and vomiting. Advise immediate hospital visit if symptoms occur. Emphasize weight loss to reduce recurrence risk and improve surgical outcomes. Hospital discharge summary, labs and imaging all reviewed today.

## 2024-06-14 DIAGNOSIS — Z6841 Body Mass Index (BMI) 40.0 and over, adult: Secondary | ICD-10-CM | POA: Diagnosis not present

## 2024-06-14 DIAGNOSIS — M6281 Muscle weakness (generalized): Secondary | ICD-10-CM | POA: Diagnosis not present

## 2024-06-14 DIAGNOSIS — R29898 Other symptoms and signs involving the musculoskeletal system: Secondary | ICD-10-CM | POA: Diagnosis not present

## 2024-06-14 DIAGNOSIS — Z7189 Other specified counseling: Secondary | ICD-10-CM | POA: Diagnosis not present

## 2024-06-14 DIAGNOSIS — E66813 Obesity, class 3: Secondary | ICD-10-CM | POA: Diagnosis not present

## 2024-06-14 DIAGNOSIS — Z7689 Persons encountering health services in other specified circumstances: Secondary | ICD-10-CM | POA: Diagnosis not present

## 2024-06-14 DIAGNOSIS — Z713 Dietary counseling and surveillance: Secondary | ICD-10-CM | POA: Diagnosis not present

## 2024-06-17 ENCOUNTER — Encounter: Payer: Self-pay | Admitting: Nurse Practitioner

## 2024-06-21 DIAGNOSIS — F411 Generalized anxiety disorder: Secondary | ICD-10-CM | POA: Diagnosis not present

## 2024-06-21 DIAGNOSIS — F4320 Adjustment disorder, unspecified: Secondary | ICD-10-CM | POA: Diagnosis not present

## 2024-06-25 ENCOUNTER — Ambulatory Visit: Admitting: Nurse Practitioner

## 2024-06-28 ENCOUNTER — Other Ambulatory Visit: Payer: Self-pay | Admitting: Nurse Practitioner

## 2024-06-28 DIAGNOSIS — L219 Seborrheic dermatitis, unspecified: Secondary | ICD-10-CM

## 2024-07-13 DIAGNOSIS — E66813 Obesity, class 3: Secondary | ICD-10-CM | POA: Diagnosis not present

## 2024-07-13 DIAGNOSIS — Z713 Dietary counseling and surveillance: Secondary | ICD-10-CM | POA: Diagnosis not present

## 2024-07-15 ENCOUNTER — Other Ambulatory Visit: Payer: Self-pay | Admitting: Nurse Practitioner

## 2024-07-15 DIAGNOSIS — Z1231 Encounter for screening mammogram for malignant neoplasm of breast: Secondary | ICD-10-CM

## 2024-07-28 ENCOUNTER — Encounter: Payer: Self-pay | Admitting: Nurse Practitioner

## 2024-07-29 NOTE — Progress Notes (Signed)
 Consults Nutrition status: cleared (07/13/24) Psyche Status: Approved (06/21/24) Physical Therapy Status: Cleared Insurance Has benefits: yes Med/Surg Review Labs: Ordered ECG: Ordered CXR: Ordered EGD: Scheduled (EGD/Colonoscopy: Sch'd for 08/12/24) Sleep study: n/a (Stop Band=4; Epworth=3) Urine Nicotine: Not Cleared Other: Other Consults (see comment) ([employer]: 6 mwl visits: 1. 03/02/24 PCP, 2. 04/07/24 PCP,   3. 06/15/23 RN, 4. 07/13/24 RD,  5.             6.) Next review date: 09/03/24 ([facility] 06/14/24: EGD/Colonscopy; Labs/CXR/EKG; 6 mwl visits (needs 2 more); PCP Statement, needs Mammogram; [employer] final visit with [place]) Additional Comments: [facility] 06/14/24: EGD/Colonscopy; Labs/CXR/EKG; 6 mwl visits (needs 2 more); PCP Statement, needs Mammogram; [employer] final visit with [place]

## 2024-08-03 ENCOUNTER — Telehealth: Payer: Self-pay | Admitting: Nurse Practitioner

## 2024-08-03 ENCOUNTER — Other Ambulatory Visit: Payer: Self-pay | Admitting: Nurse Practitioner

## 2024-08-03 NOTE — Telephone Encounter (Signed)
 Pt came to pick up letter for bariatrics. -kh

## 2024-08-03 NOTE — Telephone Encounter (Signed)
 Copied from CRM [id number]. Topic: General - Other >> Aug 03, 2024  2:01 PM Emylou G wrote: Reason for CRM: Pls call patient.. she is checking on status of Letter for Bariatrics.. message left to doctor on mychart?  She is okay with messaging her on mychart

## 2024-08-04 DIAGNOSIS — R9431 Abnormal electrocardiogram [ECG] [EKG]: Secondary | ICD-10-CM | POA: Diagnosis not present

## 2024-08-04 DIAGNOSIS — R918 Other nonspecific abnormal finding of lung field: Secondary | ICD-10-CM | POA: Diagnosis not present

## 2024-08-04 DIAGNOSIS — I452 Bifascicular block: Secondary | ICD-10-CM | POA: Diagnosis not present

## 2024-08-04 DIAGNOSIS — I77810 Thoracic aortic ectasia: Secondary | ICD-10-CM | POA: Diagnosis not present

## 2024-08-04 DIAGNOSIS — Z01818 Encounter for other preprocedural examination: Secondary | ICD-10-CM | POA: Diagnosis not present

## 2024-08-04 DIAGNOSIS — Z6841 Body Mass Index (BMI) 40.0 and over, adult: Secondary | ICD-10-CM | POA: Diagnosis not present

## 2024-08-04 DIAGNOSIS — M479 Spondylosis, unspecified: Secondary | ICD-10-CM | POA: Diagnosis not present

## 2024-08-04 DIAGNOSIS — Z7689 Persons encountering health services in other specified circumstances: Secondary | ICD-10-CM | POA: Diagnosis not present

## 2024-08-05 DIAGNOSIS — Z7189 Other specified counseling: Secondary | ICD-10-CM | POA: Diagnosis not present

## 2024-08-05 DIAGNOSIS — E66813 Obesity, class 3: Secondary | ICD-10-CM | POA: Diagnosis not present

## 2024-08-05 DIAGNOSIS — Z713 Dietary counseling and surveillance: Secondary | ICD-10-CM | POA: Diagnosis not present

## 2024-08-09 ENCOUNTER — Ambulatory Visit
Admission: RE | Admit: 2024-08-09 | Discharge: 2024-08-09 | Disposition: A | Discharge status: Home / Self Care | Source: Ambulatory Visit | Attending: Nurse Practitioner | Admitting: Nurse Practitioner

## 2024-08-09 DIAGNOSIS — Z1231 Encounter for screening mammogram for malignant neoplasm of breast: Secondary | ICD-10-CM | POA: Insufficient documentation

## 2024-08-10 ENCOUNTER — Encounter: Payer: Self-pay | Admitting: Nurse Practitioner

## 2024-08-10 NOTE — Telephone Encounter (Signed)
 fyi

## 2024-08-11 ENCOUNTER — Ambulatory Visit: Payer: Self-pay | Admitting: Nurse Practitioner

## 2024-09-03 ENCOUNTER — Encounter: Payer: Self-pay | Admitting: Nurse Practitioner

## 2024-09-03 ENCOUNTER — Ambulatory Visit (INDEPENDENT_AMBULATORY_CARE_PROVIDER_SITE_OTHER): Admitting: Nurse Practitioner

## 2024-09-03 VITALS — BP 118/74 | HR 77 | Temp 97.9°F | Ht 66.0 in | Wt 272.8 lb

## 2024-09-03 DIAGNOSIS — E66813 Obesity, class 3: Secondary | ICD-10-CM

## 2024-09-03 DIAGNOSIS — L409 Psoriasis, unspecified: Secondary | ICD-10-CM | POA: Insufficient documentation

## 2024-09-03 NOTE — Assessment & Plan Note (Signed)
 Considering VSG due to obesity complicated by hernia and adhesions. The hernia requires mesh repair, and VSG is recommended to avoid lower abdominal surgery. There is concern about the success of hernia repair post-weight loss. Pre-surgical evaluations are complete, with colonoscopy and EGD scheduled to assess contraindications. She has been informed about preoperative and postoperative dietary requirements. Proceed with colonoscopy and EGD on September 16, 2024. Schedule a follow-up with the surgeon post-procedures to discuss hernia repair post-weight loss and consider VSG for obesity and hernia management.

## 2024-09-03 NOTE — Progress Notes (Signed)
 Leron Glance, NP-C Phone: [phone]  Ruth Pennington is a 53 y.o. female who presents today for follow up.   Discussed the use of AI scribe software for clinical note transcription with the patient, who gave verbal consent to proceed.  History of Present Illness   Ruth Pennington is a 53 year old female who presents for a three-month follow-up visit.  She had to reschedule her colonoscopy due to a family emergency. Over the summer, she was doing well but experienced a setback in September, describing it as 'falling off the wagon.' While on Zepbound , she did not experience aches, which facilitated her physical activity, but since stopping the medication, her symptoms have worsened.  She has a persistent scalp condition that has worsened over the last month. While on Zepbound , the condition was manageable, but now it is constant and bothersome. She uses a shampoo twice a week as directed but is concerned about hair thinning. There is a family history of psoriasis, as her mother developed it later in life.  She is considering a vertical sleeve gastrectomy (VSG) due to past surgeries and adhesions. She has completed the necessary pre-surgical evaluations, including psychology and bariatric counseling. She is concerned about the implications of the surgery, especially regarding her hernia and the potential need for future surgeries.  Her husband is undergoing a total shoulder replacement and has alpha-1 antitrypsin deficiency. She is aware of the genetic implications and has informed his family members to get tested.  She experiences aches, particularly in her shoulders, and is exploring dietary changes to address potential post-menopausal symptoms. She is in regular contact with a dietitian and is considering eliminating certain foods to reduce inflammation. No recent heartburn and has not taken medication for it in over eight years.      Social History   Tobacco Use  Smoking Status Never   Smokeless Tobacco Never    Current Outpatient Medications on File Prior to Visit  Medication Sig Dispense Refill   acetaminophen  (TYLENOL ) 500 MG tablet Take by mouth every 6 (six) hours as needed.      ketoconazole  (NIZORAL ) 2 % shampoo APPLY TOPICALLY 2 TIMES A WEEK AS DIRECTED 120 mL 5   pantoprazole  (PROTONIX ) 20 MG tablet Take 1 tablet (20 mg total) by mouth daily. (Patient not taking: Reported on 09/03/2024) 30 tablet 0   No current facility-administered medications on file prior to visit.     ROS see history of present illness  Objective  Physical Exam Vitals:   09/03/24 0839  BP: 118/74  Pulse: 77  Temp: 97.9 F (36.6 C)  SpO2: 98%    BP Readings from Last 3 Encounters:  09/03/24 118/74  06/03/24 122/80  05/19/24 106/76   Wt Readings from Last 3 Encounters:  09/03/24 272 lb 12.8 oz (123.7 kg)  06/03/24 269 lb 6.4 oz (122.2 kg)  05/17/24 270 lb (122.5 kg)    Physical Exam Constitutional:      General: She is not in acute distress.    Appearance: Normal appearance. She is obese.  HENT:     Head: Normocephalic.  Cardiovascular:     Rate and Rhythm: Normal rate and regular rhythm.     Heart sounds: Normal heart sounds.  Pulmonary:     Effort: Pulmonary effort is normal.     Breath sounds: Normal breath sounds.  Skin:    General: Skin is warm and dry.  Neurological:     General: No focal deficit present.     Mental  Status: She is alert.  Psychiatric:        Mood and Affect: Mood normal.        Behavior: Behavior normal.      Assessment/Plan: Please see individual problem list.  Obesity, Class III, BMI 40-49.9 (morbid obesity) (HCC) Assessment & Plan: Considering VSG due to obesity complicated by hernia and adhesions. The hernia requires mesh repair, and VSG is recommended to avoid lower abdominal surgery. There is concern about the success of hernia repair post-weight loss. Pre-surgical evaluations are complete, with colonoscopy and EGD scheduled  to assess contraindications. She has been informed about preoperative and postoperative dietary requirements. Proceed with colonoscopy and EGD on September 16, 2024. Schedule a follow-up with the surgeon post-procedures to discuss hernia repair post-weight loss and consider VSG for obesity and hernia management.    Psoriasis of scalp Assessment & Plan: Previously treated as seborrheic dermatitis with little response, consistent with psoriasis. Scalp psoriasis is worsening, previously managed with ketoconazole  shampoo. There is concern about its systemic nature and joint aches, but systemic medications are avoided due to upcoming surgery. Continue ketoconazole  shampoo twice weekly for eight weeks, then as needed. Reassess the scalp condition post-obesity management and potential surgery.      Return in about 3 months (around 12/04/2024) for Follow up.   Leron Glance, NP-C [facility]

## 2024-09-03 NOTE — Assessment & Plan Note (Signed)
 Previously treated as seborrheic dermatitis with little response, consistent with psoriasis. Scalp psoriasis is worsening, previously managed with ketoconazole  shampoo. There is concern about its systemic nature and joint aches, but systemic medications are avoided due to upcoming surgery. Continue ketoconazole  shampoo twice weekly for eight weeks, then as needed. Reassess the scalp condition post-obesity management and potential surgery.

## 2024-09-06 DIAGNOSIS — Z713 Dietary counseling and surveillance: Secondary | ICD-10-CM | POA: Diagnosis not present

## 2024-09-16 DIAGNOSIS — K317 Polyp of stomach and duodenum: Secondary | ICD-10-CM | POA: Diagnosis not present

## 2024-09-16 DIAGNOSIS — Z1211 Encounter for screening for malignant neoplasm of colon: Secondary | ICD-10-CM | POA: Diagnosis not present

## 2024-09-16 DIAGNOSIS — K621 Rectal polyp: Secondary | ICD-10-CM | POA: Diagnosis not present

## 2024-09-16 DIAGNOSIS — K5730 Diverticulosis of large intestine without perforation or abscess without bleeding: Secondary | ICD-10-CM | POA: Diagnosis not present

## 2024-09-20 DIAGNOSIS — Z6841 Body Mass Index (BMI) 40.0 and over, adult: Secondary | ICD-10-CM | POA: Diagnosis not present

## 2024-09-20 DIAGNOSIS — Z01818 Encounter for other preprocedural examination: Secondary | ICD-10-CM | POA: Diagnosis not present

## 2024-09-20 DIAGNOSIS — K621 Rectal polyp: Secondary | ICD-10-CM | POA: Diagnosis not present

## 2024-09-20 DIAGNOSIS — E7800 Pure hypercholesterolemia, unspecified: Secondary | ICD-10-CM | POA: Diagnosis not present

## 2024-09-20 DIAGNOSIS — Z01810 Encounter for preprocedural cardiovascular examination: Secondary | ICD-10-CM | POA: Diagnosis not present

## 2024-09-20 DIAGNOSIS — Z87891 Personal history of nicotine dependence: Secondary | ICD-10-CM | POA: Diagnosis not present

## 2024-09-20 DIAGNOSIS — K219 Gastro-esophageal reflux disease without esophagitis: Secondary | ICD-10-CM | POA: Diagnosis not present

## 2024-09-20 DIAGNOSIS — K5730 Diverticulosis of large intestine without perforation or abscess without bleeding: Secondary | ICD-10-CM | POA: Diagnosis not present

## 2024-09-20 DIAGNOSIS — K432 Incisional hernia without obstruction or gangrene: Secondary | ICD-10-CM | POA: Diagnosis not present

## 2024-09-20 DIAGNOSIS — I10 Essential (primary) hypertension: Secondary | ICD-10-CM | POA: Diagnosis not present

## 2024-09-20 DIAGNOSIS — Z8719 Personal history of other diseases of the digestive system: Secondary | ICD-10-CM | POA: Diagnosis not present

## 2024-09-20 DIAGNOSIS — R9431 Abnormal electrocardiogram [ECG] [EKG]: Secondary | ICD-10-CM | POA: Diagnosis not present

## 2024-09-23 DIAGNOSIS — R9431 Abnormal electrocardiogram [ECG] [EKG]: Secondary | ICD-10-CM | POA: Diagnosis not present

## 2024-10-18 DIAGNOSIS — Z6841 Body Mass Index (BMI) 40.0 and over, adult: Secondary | ICD-10-CM | POA: Diagnosis not present

## 2024-10-18 DIAGNOSIS — R9431 Abnormal electrocardiogram [ECG] [EKG]: Secondary | ICD-10-CM | POA: Diagnosis not present

## 2024-10-18 DIAGNOSIS — Z01810 Encounter for preprocedural cardiovascular examination: Secondary | ICD-10-CM | POA: Diagnosis not present

## 2024-11-02 DIAGNOSIS — K56609 Unspecified intestinal obstruction, unspecified as to partial versus complete obstruction: Secondary | ICD-10-CM | POA: Diagnosis not present

## 2024-11-02 DIAGNOSIS — I10 Essential (primary) hypertension: Secondary | ICD-10-CM | POA: Diagnosis not present

## 2024-11-02 DIAGNOSIS — Z6841 Body Mass Index (BMI) 40.0 and over, adult: Secondary | ICD-10-CM | POA: Diagnosis not present

## 2024-11-02 DIAGNOSIS — Z90722 Acquired absence of ovaries, bilateral: Secondary | ICD-10-CM | POA: Diagnosis not present

## 2024-11-02 DIAGNOSIS — Z90710 Acquired absence of both cervix and uterus: Secondary | ICD-10-CM | POA: Diagnosis not present

## 2024-11-02 DIAGNOSIS — Z01818 Encounter for other preprocedural examination: Secondary | ICD-10-CM | POA: Diagnosis not present

## 2024-11-02 DIAGNOSIS — Z87891 Personal history of nicotine dependence: Secondary | ICD-10-CM | POA: Diagnosis not present

## 2024-11-02 DIAGNOSIS — C541 Malignant neoplasm of endometrium: Secondary | ICD-10-CM | POA: Diagnosis not present

## 2024-11-02 DIAGNOSIS — R058 Other specified cough: Secondary | ICD-10-CM | POA: Diagnosis not present

## 2024-11-02 DIAGNOSIS — Z8542 Personal history of malignant neoplasm of other parts of uterus: Secondary | ICD-10-CM | POA: Diagnosis not present

## 2024-11-16 DIAGNOSIS — I10 Essential (primary) hypertension: Secondary | ICD-10-CM | POA: Diagnosis not present

## 2024-11-16 DIAGNOSIS — K219 Gastro-esophageal reflux disease without esophagitis: Secondary | ICD-10-CM | POA: Diagnosis not present

## 2024-11-16 DIAGNOSIS — Z6841 Body Mass Index (BMI) 40.0 and over, adult: Secondary | ICD-10-CM | POA: Diagnosis not present

## 2024-11-16 HISTORY — PX: GASTRECTOMY, SLEEVE, ROBOT-ASSISTED: SHX7230

## 2024-12-07 ENCOUNTER — Ambulatory Visit: Admitting: Nurse Practitioner

## 2024-12-13 ENCOUNTER — Encounter: Payer: Self-pay | Admitting: Nurse Practitioner

## 2024-12-13 ENCOUNTER — Ambulatory Visit: Admitting: Nurse Practitioner

## 2024-12-13 VITALS — BP 104/74 | HR 86 | Ht 66.0 in | Wt 245.2 lb

## 2024-12-13 DIAGNOSIS — K56609 Unspecified intestinal obstruction, unspecified as to partial versus complete obstruction: Secondary | ICD-10-CM

## 2024-12-13 DIAGNOSIS — Z903 Acquired absence of stomach [part of]: Secondary | ICD-10-CM | POA: Insufficient documentation

## 2024-12-13 DIAGNOSIS — K432 Incisional hernia without obstruction or gangrene: Secondary | ICD-10-CM

## 2024-12-13 DIAGNOSIS — E66813 Obesity, class 3: Secondary | ICD-10-CM

## 2024-12-13 NOTE — Progress Notes (Unsigned)
" °  Leron Glance, NP-C Phone: [phone]  Ruth Pennington is a 54 y.o. female who presents today for follow up.   ***  Tobacco Use History[1]  Medications Ordered Prior to Encounter[2]   ROS see history of present illness  Objective  Physical Exam Vitals:   12/13/24 1605  BP: 104/74  Pulse: 86  SpO2: 96%    BP Readings from Last 3 Encounters:  12/13/24 104/74  09/03/24 118/74  06/03/24 122/80   Wt Readings from Last 3 Encounters:  12/13/24 245 lb 3.2 oz (111.2 kg)  09/03/24 272 lb 12.8 oz (123.7 kg)  06/03/24 269 lb 6.4 oz (122.2 kg)    Physical Exam Constitutional:      General: She is not in acute distress.    Appearance: Normal appearance.  HENT:     Head: Normocephalic.  Cardiovascular:     Rate and Rhythm: Normal rate and regular rhythm.     Heart sounds: Normal heart sounds.  Pulmonary:     Effort: Pulmonary effort is normal.     Breath sounds: Normal breath sounds.  Skin:    General: Skin is warm and dry.  Neurological:     General: No focal deficit present.     Mental Status: She is alert.  Psychiatric:        Mood and Affect: Mood normal.        Behavior: Behavior normal.      Assessment/Plan: Please see individual problem list.  S/P gastric sleeve procedure  SBO (small bowel obstruction) (HCC)  Incisional hernia, without obstruction or gangrene  Obesity, Class III, BMI 40-49.9 (morbid obesity) (HCC)     Return in about 6 months (around 06/12/2025) for Follow up.   Leron Glance, NP-C [facility] [place]     [1]  Social History Tobacco Use  Smoking Status Never  Smokeless Tobacco Never  [2]  Current Outpatient Medications on File Prior to Visit  Medication Sig Dispense Refill   acetaminophen  (TYLENOL ) 500 MG tablet Take by mouth every 6 (six) hours as needed.      CALCIUM CITRATE PO Take by mouth.     ketoconazole  (NIZORAL ) 2 % shampoo APPLY TOPICALLY 2 TIMES A WEEK AS DIRECTED 120 mL 5   Multiple  Vitamin (MULTI-VITAMIN) tablet Take 1 tablet by mouth daily.     No current facility-administered medications on file prior to visit.   "

## 2025-06-16 ENCOUNTER — Ambulatory Visit: Admitting: Nurse Practitioner
